# Patient Record
Sex: Female | Born: 1975
Health system: Southern US, Community
[De-identification: ages and names within clinical notes are randomized; demographics above are authoritative.]

## PROBLEM LIST (undated history)

## (undated) DIAGNOSIS — R030 Elevated blood-pressure reading, without diagnosis of hypertension: Secondary | ICD-10-CM

## (undated) DIAGNOSIS — I10 Essential (primary) hypertension: Secondary | ICD-10-CM

## (undated) DIAGNOSIS — U071 COVID-19: Secondary | ICD-10-CM

## (undated) DIAGNOSIS — D649 Anemia, unspecified: Secondary | ICD-10-CM

## (undated) HISTORY — DX: Essential (primary) hypertension: I10

---

## 2004-03-02 ENCOUNTER — Emergency Department (HOSPITAL_COMMUNITY): Admission: EM | Admit: 2004-03-02 | Discharge: 2004-03-02 | Payer: Self-pay | Admitting: Family Medicine

## 2004-03-05 ENCOUNTER — Emergency Department (HOSPITAL_COMMUNITY): Admission: EM | Admit: 2004-03-05 | Discharge: 2004-03-05 | Payer: Self-pay | Admitting: Family Medicine

## 2004-03-07 ENCOUNTER — Emergency Department (HOSPITAL_COMMUNITY): Admission: EM | Admit: 2004-03-07 | Discharge: 2004-03-07 | Payer: Self-pay | Admitting: Family Medicine

## 2004-03-10 ENCOUNTER — Emergency Department (HOSPITAL_COMMUNITY): Admission: EM | Admit: 2004-03-10 | Discharge: 2004-03-10 | Payer: Self-pay | Admitting: Family Medicine

## 2004-07-03 ENCOUNTER — Emergency Department (HOSPITAL_COMMUNITY): Admission: EM | Admit: 2004-07-03 | Discharge: 2004-07-03 | Payer: Self-pay | Admitting: Family Medicine

## 2004-07-06 ENCOUNTER — Emergency Department (HOSPITAL_COMMUNITY): Admission: EM | Admit: 2004-07-06 | Discharge: 2004-07-06 | Payer: Self-pay | Admitting: Family Medicine

## 2004-10-10 ENCOUNTER — Emergency Department (HOSPITAL_COMMUNITY): Admission: EM | Admit: 2004-10-10 | Discharge: 2004-10-10 | Payer: Self-pay | Admitting: *Deleted

## 2005-08-30 HISTORY — PX: UNILATERAL SALPINGECTOMY: SHX6160

## 2005-08-31 ENCOUNTER — Inpatient Hospital Stay (HOSPITAL_COMMUNITY): Admission: EM | Admit: 2005-08-31 | Discharge: 2005-09-02 | Payer: Self-pay | Admitting: Emergency Medicine

## 2006-10-26 ENCOUNTER — Emergency Department (HOSPITAL_COMMUNITY): Admission: EM | Admit: 2006-10-26 | Discharge: 2006-10-26 | Payer: Self-pay | Admitting: Family Medicine

## 2006-12-10 ENCOUNTER — Emergency Department (HOSPITAL_COMMUNITY): Admission: EM | Admit: 2006-12-10 | Discharge: 2006-12-10 | Payer: Self-pay | Admitting: Emergency Medicine

## 2006-12-12 ENCOUNTER — Emergency Department (HOSPITAL_COMMUNITY): Admission: EM | Admit: 2006-12-12 | Discharge: 2006-12-12 | Payer: Self-pay | Admitting: Family Medicine

## 2011-01-13 ENCOUNTER — Emergency Department (HOSPITAL_COMMUNITY)
Admission: EM | Admit: 2011-01-13 | Discharge: 2011-01-13 | Disposition: A | Discharge status: Home / Self Care | Payer: Self-pay | Attending: Emergency Medicine | Admitting: Emergency Medicine

## 2011-01-13 DIAGNOSIS — L0291 Cutaneous abscess, unspecified: Secondary | ICD-10-CM | POA: Insufficient documentation

## 2011-01-13 DIAGNOSIS — L02419 Cutaneous abscess of limb, unspecified: Secondary | ICD-10-CM | POA: Insufficient documentation

## 2011-01-17 ENCOUNTER — Emergency Department (HOSPITAL_COMMUNITY)
Admission: EM | Admit: 2011-01-17 | Discharge: 2011-01-17 | Disposition: A | Discharge status: Home / Self Care | Payer: Self-pay | Attending: Emergency Medicine | Admitting: Emergency Medicine

## 2011-01-17 DIAGNOSIS — L03119 Cellulitis of unspecified part of limb: Secondary | ICD-10-CM | POA: Insufficient documentation

## 2011-01-17 DIAGNOSIS — Z09 Encounter for follow-up examination after completed treatment for conditions other than malignant neoplasm: Secondary | ICD-10-CM | POA: Insufficient documentation

## 2011-01-17 DIAGNOSIS — L02419 Cutaneous abscess of limb, unspecified: Secondary | ICD-10-CM | POA: Insufficient documentation

## 2011-02-08 ENCOUNTER — Inpatient Hospital Stay (INDEPENDENT_AMBULATORY_CARE_PROVIDER_SITE_OTHER)
Admission: RE | Admit: 2011-02-08 | Discharge: 2011-02-08 | Disposition: A | Discharge status: Home / Self Care | Payer: Self-pay | Source: Ambulatory Visit | Attending: Emergency Medicine | Admitting: Emergency Medicine

## 2011-02-08 DIAGNOSIS — L02419 Cutaneous abscess of limb, unspecified: Secondary | ICD-10-CM

## 2011-02-11 LAB — CULTURE, ROUTINE-ABSCESS: Gram Stain: NONE SEEN

## 2011-04-17 ENCOUNTER — Emergency Department (HOSPITAL_COMMUNITY)
Admission: EM | Admit: 2011-04-17 | Discharge: 2011-04-17 | Disposition: A | Discharge status: Home / Self Care | Payer: Self-pay | Attending: Emergency Medicine | Admitting: Emergency Medicine

## 2011-04-17 DIAGNOSIS — B3731 Acute candidiasis of vulva and vagina: Secondary | ICD-10-CM | POA: Insufficient documentation

## 2011-04-17 DIAGNOSIS — B373 Candidiasis of vulva and vagina: Secondary | ICD-10-CM | POA: Insufficient documentation

## 2011-04-17 LAB — WET PREP, GENITAL: Trich, Wet Prep: NONE SEEN

## 2011-04-17 LAB — URINALYSIS, ROUTINE W REFLEX MICROSCOPIC
Bilirubin Urine: NEGATIVE
Glucose, UA: NEGATIVE mg/dL
Hgb urine dipstick: NEGATIVE
Specific Gravity, Urine: 1.024 (ref 1.005–1.030)

## 2011-04-17 LAB — POCT PREGNANCY, URINE: Preg Test, Ur: NEGATIVE

## 2011-04-19 LAB — GC/CHLAMYDIA PROBE AMP, GENITAL: GC Probe Amp, Genital: NEGATIVE

## 2012-05-18 ENCOUNTER — Encounter (HOSPITAL_COMMUNITY): Payer: Self-pay | Admitting: *Deleted

## 2012-05-18 ENCOUNTER — Emergency Department (HOSPITAL_COMMUNITY)
Admission: EM | Admit: 2012-05-18 | Discharge: 2012-05-18 | Disposition: A | Discharge status: Home / Self Care | Payer: Self-pay | Attending: Emergency Medicine | Admitting: Emergency Medicine

## 2012-05-18 DIAGNOSIS — M549 Dorsalgia, unspecified: Secondary | ICD-10-CM

## 2012-05-18 DIAGNOSIS — M545 Low back pain, unspecified: Secondary | ICD-10-CM | POA: Insufficient documentation

## 2012-05-18 DIAGNOSIS — M62838 Other muscle spasm: Secondary | ICD-10-CM | POA: Insufficient documentation

## 2012-05-18 DIAGNOSIS — R109 Unspecified abdominal pain: Secondary | ICD-10-CM | POA: Insufficient documentation

## 2012-05-18 DIAGNOSIS — IMO0001 Reserved for inherently not codable concepts without codable children: Secondary | ICD-10-CM | POA: Insufficient documentation

## 2012-05-18 LAB — URINALYSIS, ROUTINE W REFLEX MICROSCOPIC
Bilirubin Urine: NEGATIVE
Glucose, UA: NEGATIVE mg/dL
Hgb urine dipstick: NEGATIVE
Ketones, ur: NEGATIVE mg/dL
Protein, ur: NEGATIVE mg/dL
Urobilinogen, UA: 1 mg/dL (ref 0.0–1.0)
pH: 6 (ref 5.0–8.0)

## 2012-05-18 MED ORDER — NAPROXEN 500 MG PO TABS: 500.0000 mg | ORAL_TABLET | Freq: Two times a day (BID) | ORAL | Status: DC

## 2012-05-18 MED ORDER — KETOROLAC TROMETHAMINE 60 MG/2ML IM SOLN
60.0000 mg | Freq: Once | INTRAMUSCULAR | Status: AC
  Administered 2012-05-18: 60 mg via INTRAMUSCULAR
  Filled 2012-05-18: qty 2

## 2012-05-18 NOTE — ED Provider Notes (Signed)
Medical screening examination/treatment/procedure(s) were performed by non-physician practitioner and as supervising physician I was immediately available for consultation/collaboration.  Joshwa Hemric, MD 05/18/12 2032 

## 2012-05-18 NOTE — ED Notes (Signed)
Patient with c/o bilateral flank.side pain for about two weeks and states that the pain is worse when she pick up heavy objects.  Patient denies any urinary symptoms.

## 2012-05-18 NOTE — ED Provider Notes (Signed)
History     CSN: [id number]  Arrival date & time 05/18/12  1624   First MD Initiated Contact with Patient 05/18/12 1741      Chief Complaint  Patient presents with  . Flank Pain    (Consider location/radiation/quality/duration/timing/severity/associated sxs/prior treatment) HPI Comments: 72 old female with a history of hypertension and diabetes presents emergency department with a chief complaint of back pain.  Onset of symptoms began approximately 2 weeks ago and been gradually worsening.  Pain is located in bilateral flank areas.  Pain is worsened with heavy lifting and movement.  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 Pt denies being sexually active, having urinary symptoms, radiating pain, lower extremity weakness or tingling, fever, nights sweats or chills. Pt has taken goody powder with only minimal relief.   The history is provided by the patient.    History reviewed. No pertinent past medical history.  History reviewed. No pertinent past surgical history.  History reviewed. No pertinent family history.  History  Substance Use Topics  . Smoking status: Current Some Day Smoker  . Smokeless tobacco: Not on file  . Alcohol Use: Yes    OB History    Grav Para Term Preterm Abortions TAB SAB Ect Mult Living                  Review of Systems  Constitutional: Negative for fever, chills and appetite change.  HENT: Negative for congestion.   Eyes: Negative for visual disturbance.  Respiratory: Negative for shortness of breath.   Cardiovascular: Negative for chest pain and leg swelling.  Gastrointestinal: Negative for abdominal pain.  Genitourinary: Negative for dysuria, urgency and frequency.  Musculoskeletal: Positive for myalgias and back pain.  Neurological: Negative for dizziness, syncope, weakness, light-headedness, numbness and headaches.  Psychiatric/Behavioral: Negative for confusion.    Allergies  Sulfa antibiotics  Home Medications    Current Outpatient Rx  Name Route Sig Dispense Refill  . BC HEADACHE POWDER PO Oral Take 1 packet by mouth 2 (two) times daily as needed. For pain      BP 143/76  Pulse 74  Temp 98.4 F (36.9 C) (Oral)  Resp 16  SpO2 98%  Physical Exam  Nursing note and vitals reviewed. Constitutional: She is oriented to person, place, and time. She appears well-developed and well-nourished. No distress.  HENT:  Head: Normocephalic and atraumatic.  Eyes: Conjunctivae normal and EOM are normal. Pupils are equal, round, and reactive to light. No scleral icterus.  Neck: Normal range of motion and full passive range of motion without pain. Neck supple. No spinous process tenderness and no muscular tenderness present. No rigidity. Normal range of motion present. No Brudzinski's sign noted.  Cardiovascular: Normal rate, regular rhythm and intact distal pulses.  Exam reveals no gallop and no friction rub.   No murmur heard. Pulmonary/Chest: Effort normal and breath sounds normal. No respiratory distress. She has no wheezes. She has no rales. She exhibits no tenderness.  Musculoskeletal: Normal range of motion.       Cervical back: She exhibits normal range of motion, no tenderness, no bony tenderness and no pain.       Thoracic back: She exhibits no tenderness, no bony tenderness and no pain.       Lumbar back: She exhibits tenderness and pain. She exhibits no bony tenderness, no spasm and normal pulse.       Back:       Right foot: She exhibits no swelling.  Left foot: She exhibits no swelling.       Bilateral lower extremities nontender without color change, baseline range of motion of extremities with intact distal pulses, capillary refill less than 2 seconds bilaterally.  Pt has increased pain w ROM of lumbar spine. Pain w ambulation, no sign of ataxia.  Neurological: She is alert and oriented to person, place, and time. She has normal strength and normal reflexes. No sensory deficit. Gait  normal.       Sensation at baseline for light touch in all 4 distal extremities, motor symmetric & bilateral 5/5 (hips: abduction, adduction, flexion; knee: flexion & extension; foot: dorsiflexion, plantar flexion, toes: dorsi flexion) Patellar & ankle reflexes intact.   Skin: Skin is warm and dry. No rash noted. She is not diaphoretic. No erythema. No pallor.  Psychiatric: She has a normal mood and affect. Her behavior is normal.    ED Course  Procedures (including critical care time)  Labs Reviewed  URINALYSIS, ROUTINE W REFLEX MICROSCOPIC - Abnormal; Notable for the following:    APPearance CLOUDY (*)     All other components within normal limits  POCT PREGNANCY, URINE   No results found.   No diagnosis found.    MDM  Low back pain, muscle spasms  Patient with back pain.  No neurological deficits and normal neuro exam.  Patient can walk without difficulty.  No loss of bowel or bladder control.  No concern for cauda equina.  No fever, night sweats, weight loss, h/o cancer, IVDU.  RICE protocol and pain medicine indicated and discussed with patient.Wk note given with 1 day off and return w no heavy lifting x 1 week. reccommended back brace.           Jaci Carrel, [place] 05/18/12 0981

## 2013-02-08 ENCOUNTER — Emergency Department (HOSPITAL_COMMUNITY): Payer: Self-pay

## 2013-02-08 ENCOUNTER — Observation Stay (HOSPITAL_COMMUNITY)
Admission: EM | Admit: 2013-02-08 | Discharge: 2013-02-09 | Disposition: A | Discharge status: Home / Self Care | Payer: Self-pay | Attending: Family Medicine | Admitting: Family Medicine

## 2013-02-08 ENCOUNTER — Encounter (HOSPITAL_COMMUNITY): Payer: Self-pay

## 2013-02-08 DIAGNOSIS — N390 Urinary tract infection, site not specified: Secondary | ICD-10-CM | POA: Insufficient documentation

## 2013-02-08 DIAGNOSIS — R109 Unspecified abdominal pain: Secondary | ICD-10-CM | POA: Insufficient documentation

## 2013-02-08 DIAGNOSIS — D62 Acute posthemorrhagic anemia: Secondary | ICD-10-CM | POA: Insufficient documentation

## 2013-02-08 DIAGNOSIS — D649 Anemia, unspecified: Secondary | ICD-10-CM

## 2013-02-08 DIAGNOSIS — N946 Dysmenorrhea, unspecified: Secondary | ICD-10-CM | POA: Insufficient documentation

## 2013-02-08 DIAGNOSIS — N920 Excessive and frequent menstruation with regular cycle: Principal | ICD-10-CM | POA: Insufficient documentation

## 2013-02-08 DIAGNOSIS — N938 Other specified abnormal uterine and vaginal bleeding: Secondary | ICD-10-CM

## 2013-02-08 DIAGNOSIS — K5900 Constipation, unspecified: Secondary | ICD-10-CM | POA: Insufficient documentation

## 2013-02-08 DIAGNOSIS — D259 Leiomyoma of uterus, unspecified: Secondary | ICD-10-CM | POA: Insufficient documentation

## 2013-02-08 LAB — URINALYSIS, ROUTINE W REFLEX MICROSCOPIC
Protein, ur: 30 mg/dL — AB
Urobilinogen, UA: 0.2 mg/dL (ref 0.0–1.0)
pH: 5.5 (ref 5.0–8.0)

## 2013-02-08 LAB — CBC WITH DIFFERENTIAL/PLATELET
Basophils Relative: 1 % (ref 0–1)
HCT: 12.2 % — ABNORMAL LOW (ref 36.0–46.0)
Lymphocytes Relative: 26 % (ref 12–46)
MCHC: 26.2 g/dL — ABNORMAL LOW (ref 30.0–36.0)
MCV: 57 fL — ABNORMAL LOW (ref 78.0–100.0)
Neutro Abs: 3.3 10*3/uL (ref 1.7–7.7)
Neutrophils Relative %: 66 % (ref 43–77)
Platelets: 510 10*3/uL — ABNORMAL HIGH (ref 150–400)
RBC: 2.14 MIL/uL — ABNORMAL LOW (ref 3.87–5.11)
RDW: 24 % — ABNORMAL HIGH (ref 11.5–15.5)

## 2013-02-08 LAB — URINE MICROSCOPIC-ADD ON

## 2013-02-08 LAB — PREPARE RBC (CROSSMATCH)

## 2013-02-08 LAB — ABO/RH: ABO/RH(D): O POS

## 2013-02-08 LAB — POCT I-STAT, CHEM 8
HCT: 13 % — ABNORMAL LOW (ref 36.0–46.0)
Hemoglobin: 4.4 g/dL — CL (ref 12.0–15.0)
Potassium: 3.6 mEq/L (ref 3.5–5.1)
Sodium: 140 mEq/L (ref 135–145)
TCO2: 23 mmol/L (ref 0–100)

## 2013-02-08 LAB — POCT PREGNANCY, URINE: Preg Test, Ur: NEGATIVE

## 2013-02-08 MED ORDER — ONDANSETRON HCL 4 MG/2ML IJ SOLN
4.0000 mg | Freq: Once | INTRAMUSCULAR | Status: AC
  Administered 2013-02-08: 4 mg via INTRAVENOUS
  Filled 2013-02-08: qty 2

## 2013-02-08 MED ORDER — DEXTROSE 5 % IV SOLN
1.0000 g | Freq: Once | INTRAVENOUS | Status: AC
  Administered 2013-02-08: 1 g via INTRAVENOUS
  Filled 2013-02-08: qty 10

## 2013-02-08 NOTE — ED Provider Notes (Signed)
Medical screening examination/treatment/procedure(s) were performed by non-physician practitioner and as supervising physician I was immediately available for consultation/collaboration.   Rolan Bucco, MD 02/08/13 2249

## 2013-02-08 NOTE — ED Provider Notes (Signed)
History     CSN: [id number]  Arrival date & time 02/08/13  7829   First MD Initiated Contact with Patient 02/08/13 2002      Chief Complaint  Patient presents with  . Abdominal Cramping  . Constipation  . Dysmenorrhea    (Consider location/radiation/quality/duration/timing/severity/associated sxs/prior treatment) HPI  Jodi Mendez is a 37 y.o. female complaining of bilateral lower abdominal cramping, she has been menstruating for 2 weeks going through one pad every one to 2 hours. This is not typical for her although her menstruation is normally irregular and heavy. She is also developed fatigue, a headache and 2 episodes of nonbloody, nonbilious emesis happening over the last 3 days. She does get lightheaded when going from sitting to standing but denies any chest pain, palpitations, shortness of breath. Patient states that she's also been constipated with infrequent bowel movements for the last week. Patient rates her lower abdominal pain at 5/10, she states that this is the best it has been. She's been taking naproxen without relief. She cannot identify any exacerbating or alleviating factors. She describes it as crampy.   No PCP or OB/GYN.   History reviewed. No pertinent past medical history.  Past Surgical History  Procedure Laterality Date  . Dilation and curettage of uterus      No family history on file.  History  Substance Use Topics  . Smoking status: Never Smoker   . Smokeless tobacco: Not on file  . Alcohol Use: Yes     Comment: social    OB History   Grav Para Term Preterm Abortions TAB SAB Ect Mult Living                  Review of Systems  Constitutional: Positive for fatigue. Negative for fever.  Respiratory: Negative for shortness of breath.   Cardiovascular: Negative for chest pain.  Gastrointestinal: Negative for nausea, vomiting, abdominal pain and diarrhea.  Genitourinary: Positive for menstrual problem.  Neurological: Positive for  light-headedness.  All other systems reviewed and are negative.    Allergies  Sulfa antibiotics  Home Medications   Current Outpatient Rx  Name  Route  Sig  Dispense  Refill  . naproxen sodium (ANAPROX) 220 MG tablet   Oral   Take 220 mg by mouth 2 (two) times daily as needed (pain).            BP 124/75  Pulse 84  Temp(Src) 99.2 F (37.3 C) (Oral)  Resp 18  SpO2 100%  LMP 01/28/2013  Physical Exam  Nursing note and vitals reviewed. Constitutional: She is oriented to person, place, and time. She appears well-developed and well-nourished. No distress.  HENT:  Head: Normocephalic.  Significant conjunctival pallor  Eyes: Conjunctivae and EOM are normal. Pupils are equal, round, and reactive to light.  Neck: Normal range of motion.  Cardiovascular: Normal rate and regular rhythm.   Murmur heard. Pulmonary/Chest: Effort normal and breath sounds normal. No stridor. No respiratory distress. She has no wheezes. She has no rales.  Abdominal: Soft. Bowel sounds are normal. She exhibits no distension and no mass. There is tenderness. There is no rebound and no guarding.  Mild tenderness to deep palpation of the bilateral lower quadrants with no guarding or rebound  Musculoskeletal: Normal range of motion.  Neurological: She is alert and oriented to person, place, and time.  Psychiatric: She has a normal mood and affect.    ED Course  Procedures (including critical care time)  Labs Reviewed  CBC WITH DIFFERENTIAL - Abnormal; Notable for the following:    RBC 2.14 (*)    Hemoglobin 3.2 (*)    HCT 12.2 (*)    MCV 57.0 (*)    MCH 15.0 (*)    MCHC 26.2 (*)    RDW 24.0 (*)    Platelets 510 (*)    All other components within normal limits  URINALYSIS, ROUTINE W REFLEX MICROSCOPIC - Abnormal; Notable for the following:    Color, Urine AMBER (*)    APPearance CLOUDY (*)    Hgb urine dipstick LARGE (*)    Protein, ur 30 (*)    Leukocytes, UA SMALL (*)    All other  components within normal limits  URINE MICROSCOPIC-ADD ON - Abnormal; Notable for the following:    Bacteria, UA MANY (*)    All other components within normal limits  POCT I-STAT, CHEM 8 - Abnormal; Notable for the following:    Hemoglobin 4.4 (*)    HCT 13.0 (*)    All other components within normal limits  URINE CULTURE  POCT PREGNANCY, URINE  PREPARE RBC (CROSSMATCH)  TYPE AND SCREEN   No results found.   1. Anemia   2. DUB (dysfunctional uterine bleeding)   3. UTI (lower urinary tract infection)       MDM   Filed Vitals:   02/08/13 1922  BP: 124/75  Pulse: 84  Temp: 99.2 F (37.3 C)  TempSrc: Oral  Resp: 18  SpO2: 100%     Jodi Mendez is a 37 y.o. female  with heavy menstrual bleeding for 14 days. Patient is significantly anemic with a hemoglobin of 4.4. 2 units ordered for transfusion. Transvaginal ultrasound also ordered to evaluate because of heavy menstruation  Urinalysis shows many bacteria with 7-10 white blood cells and leukocytes. I will give her a gram of Rocephin through the IV.  Patient declines pain medication.  Pt will transferred to [facility] hospital, accepting physician Dr. Shawnie Pons   Medications  ondansetron [facility]) injection 4 mg (not administered)  cefTRIAXone (ROCEPHIN) 1 g in dextrose 5 % 50 mL IVPB (not administered)        Wynetta Emery, PA-C 02/08/13 2133

## 2013-02-08 NOTE — ED Notes (Signed)
Pt c/o heavy menstral cycle which is the second one this month, c/o vomiting off and on, abdominal pain x1wk with constipation

## 2013-02-08 NOTE — ED Notes (Signed)
Critical values on I stat.  Pt is in waiting room - has no doctor assigned.  Triage RN Victorino Dike and Charge RN Tiffany aware.  CBC has been sent to lab to confirm values.

## 2013-02-09 ENCOUNTER — Encounter (HOSPITAL_COMMUNITY): Payer: Self-pay | Admitting: Family Medicine

## 2013-02-09 DIAGNOSIS — D62 Acute posthemorrhagic anemia: Secondary | ICD-10-CM | POA: Diagnosis present

## 2013-02-09 DIAGNOSIS — N390 Urinary tract infection, site not specified: Secondary | ICD-10-CM

## 2013-02-09 DIAGNOSIS — D259 Leiomyoma of uterus, unspecified: Secondary | ICD-10-CM | POA: Diagnosis present

## 2013-02-09 DIAGNOSIS — N920 Excessive and frequent menstruation with regular cycle: Secondary | ICD-10-CM | POA: Diagnosis present

## 2013-02-09 LAB — CBC
HCT: 24.1 % — ABNORMAL LOW (ref 36.0–46.0)
MCH: 22.6 pg — ABNORMAL LOW (ref 26.0–34.0)
MCH: 22.8 pg — ABNORMAL LOW (ref 26.0–34.0)
MCHC: 32 g/dL (ref 30.0–36.0)
Platelets: 390 10*3/uL (ref 150–400)
RBC: 3.38 MIL/uL — ABNORMAL LOW (ref 3.87–5.11)
RDW: 27.1 % — ABNORMAL HIGH (ref 11.5–15.5)
RDW: 27.2 % — ABNORMAL HIGH (ref 11.5–15.5)
WBC: 5.9 10*3/uL (ref 4.0–10.5)

## 2013-02-09 LAB — PREPARE RBC (CROSSMATCH)

## 2013-02-09 LAB — ABO/RH: ABO/RH(D): O POS

## 2013-02-09 MED ORDER — DIPHENHYDRAMINE HCL 25 MG PO CAPS
25.0000 mg | ORAL_CAPSULE | Freq: Once | ORAL | Status: AC
  Administered 2013-02-09: 25 mg via ORAL
  Filled 2013-02-09: qty 1

## 2013-02-09 MED ORDER — SODIUM CHLORIDE 0.9 % IV SOLN
INTRAVENOUS | Status: DC
  Administered 2013-02-09: 20 mL/h via INTRAVENOUS

## 2013-02-09 MED ORDER — MENTHOL 3 MG MT LOZG: 1.0000 | LOZENGE | OROMUCOSAL | Status: DC | PRN

## 2013-02-09 MED ORDER — ALUM & MAG HYDROXIDE-SIMETH 200-200-20 MG/5ML PO SUSP: 30.0000 mL | ORAL | Status: DC | PRN

## 2013-02-09 MED ORDER — ONDANSETRON HCL 4 MG/2ML IJ SOLN
4.0000 mg | Freq: Once | INTRAMUSCULAR | Status: AC
  Administered 2013-02-09: 4 mg via INTRAVENOUS
  Filled 2013-02-09: qty 2

## 2013-02-09 MED ORDER — MORPHINE SULFATE 4 MG/ML IJ SOLN
4.0000 mg | Freq: Once | INTRAMUSCULAR | Status: AC
  Administered 2013-02-09: 4 mg via INTRAVENOUS
  Filled 2013-02-09: qty 1

## 2013-02-09 MED ORDER — ACETAMINOPHEN 325 MG PO TABS
650.0000 mg | ORAL_TABLET | Freq: Once | ORAL | Status: AC
  Administered 2013-02-09: 650 mg via ORAL
  Filled 2013-02-09: qty 2

## 2013-02-09 MED ORDER — IBUPROFEN 600 MG PO TABS
600.0000 mg | ORAL_TABLET | Freq: Four times a day (QID) | ORAL | Status: DC | PRN
  Administered 2013-02-09 (×2): 600 mg via ORAL
  Filled 2013-02-09 (×2): qty 1

## 2013-02-09 MED ORDER — TEMAZEPAM 15 MG PO CAPS: 15.0000 mg | ORAL_CAPSULE | Freq: Every evening | ORAL | Status: DC | PRN

## 2013-02-09 MED ORDER — OXYCODONE-ACETAMINOPHEN 5-325 MG PO TABS
1.0000 | ORAL_TABLET | ORAL | Status: DC | PRN
Start: 2013-02-09 — End: 2013-02-10
  Administered 2013-02-09: 1 via ORAL
  Filled 2013-02-09: qty 1

## 2013-02-09 MED ORDER — MEGESTROL ACETATE 40 MG PO TABS: 40.0000 mg | ORAL_TABLET | Freq: Three times a day (TID) | ORAL | Status: DC

## 2013-02-09 MED ORDER — THERA VITAL M PO TABS: 1.0000 | ORAL_TABLET | Freq: Every day | ORAL | Status: DC

## 2013-02-09 MED ORDER — GUAIFENESIN 100 MG/5ML PO SOLN: 15.0000 mL | ORAL | Status: DC | PRN

## 2013-02-09 MED ORDER — MEGESTROL ACETATE 40 MG PO TABS
40.0000 mg | ORAL_TABLET | Freq: Four times a day (QID) | ORAL | Status: DC
  Administered 2013-02-09 (×4): 40 mg via ORAL
  Filled 2013-02-09 (×7): qty 1

## 2013-02-09 NOTE — H&P (Signed)
History    Chief Complaint   Patient presents with   .  Abdominal Cramping   .  Constipation   .  Dysmenorrhea    HPI  Jodi Mendez is a 37 y.o. [id number] female complaining of bilateral lower abdominal cramping, she has been menstruating for 2 weeks going through one pad every one to 2 hours. This is not typical for her although her menstruation is normally irregular and heavy. Has been having 2 cycles/month since October. She is also developed fatigue, a headache and 2 episodes of nonbloody, nonbilious emesis happening over the last 3 days. She does get lightheaded when going from sitting to standing but denies any chest pain, palpitations, shortness of breath, although she endorses DOE. Patient states that she's also been constipated with infrequent bowel movements for the last week. Patient rates her lower abdominal pain at 5/10, she states that this is the best it has been. She's been taking naproxen without relief. She cannot identify any exacerbating or alleviating factors. She describes it as crampy.  No PCP or OB/GYN.   Past Medical History: No pertinent past medical history.   Past Surgical History   Procedure  Laterality  Date    Laparscopic salpingectomy for ectopic pregnancy Left    No family history on file.   Social History   Substance Use Topics   .  Smoking status:  Never Smoker   .  Smokeless tobacco:  Not on file   .  Alcohol Use:  Yes      Comment: social     OB History    Grav  Para  Term  Preterm  Abortions  TAB  SAB  Ect  Mult  Living    1 0 0 0 1   1  0     Review of Systems  Constitutional: Positive for fatigue. Negative for fever.  Respiratory: Negative for shortness of breath.  Cardiovascular: Negative for chest pain.  Gastrointestinal: Negative for nausea, vomiting, abdominal pain and diarrhea.  Genitourinary: Positive for menstrual problem.  Neurological: Positive for light-headedness.  All other systems reviewed and are negative.    Allergies   Sulfa antibiotics   Home Medications    Current Outpatient Rx   Name   Route   Sig   Dispense   Refill   .  naproxen sodium (ANAPROX) 220 MG tablet   Oral   Take 220 mg by mouth 2 (two) times daily as needed (pain).        Physical Exam BP 124/75  Pulse 84  Temp(Src) 99.2 F (37.3 C) (Oral)  Resp 18  SpO2 100%  LMP 01/28/2013   Nursing note and vitals reviewed.  Constitutional: She is oriented to person, place, and time. She appears well-developed and well-nourished. No distress.  HENT:  Head: Normocephalic.  Significant conjunctival pallor  Eyes: Conjunctivae and EOM are normal. Pupils are equal, round, and reactive to light.  Neck: Normal range of motion.  Cardiovascular: Normal rate and regular rhythm.  Murmur heard.  Pulmonary/Chest: Effort normal and breath sounds normal. No stridor. No respiratory distress. She has no wheezes. She has no rales.  Abdominal: Soft. Bowel sounds are normal. She exhibits no distension and no mass. There is tenderness. There is no rebound and no guarding.  Mild tenderness to deep palpation of the bilateral lower quadrants with no guarding or rebound  Musculoskeletal: Normal range of motion.  Neurological: She is alert and oriented to person, place, and time.  Psychiatric: She  has a normal mood and affect.    CBC WITH DIFFERENTIAL - Abnormal; Notable for the following:    RBC  2.14 (*)     Hemoglobin  3.2 (*)     HCT  12.2 (*)     MCV  57.0 (*)     MCH  15.0 (*)     MCHC  26.2 (*)     RDW  24.0 (*)     Platelets  510 (*)     All other components within normal limits   URINALYSIS, ROUTINE W REFLEX MICROSCOPIC - Abnormal; Notable for the following:    Color, Urine  AMBER (*)     APPearance  CLOUDY (*)     Hgb urine dipstick  LARGE (*)     Protein, ur  30 (*)     Leukocytes, UA  SMALL (*)     All other components within normal limits   URINE MICROSCOPIC-ADD ON - Abnormal; Notable for the following:    Bacteria, UA  MANY  (*)     All other components within normal limits   POCT I-STAT, CHEM 8 - Abnormal; Notable for the following:    Hemoglobin  4.4 (*)     HCT  13.0 (*)     All other components within normal limits     POCT PREGNANCY, URINE  Negative    TYPE AND SCREEN O pos   US Transvaginal Non-ob  02/09/2013   *RADIOLOGY REPORT*  Clinical Data: Heavy menses with left sided pelvic pain.  TRANSABDOMINAL AND TRANSVAGINAL ULTRASOUND OF PELVIS Technique:  Both transabdominal and transvaginal ultrasound examinations of the pelvis were performed. Transabdominal technique was performed for global imaging of the pelvis including uterus, ovaries, adnexal regions, and pelvic cul-de-sac.  It was necessary to proceed with endovaginal exam following the transabdominal exam to visualize the uterus and ovaries.  Comparison:  08/31/2005  Findings:  Uterus: The uterus measures approximately 9.8 x 6.3 x 8.8 cm. Echotexture of the uterus is quite heterogeneous with multiple fibroids suspected.  The largest measurable area measures approximately 4.9 x 3.2 x 3.0 cm at the level of the fundus.  Three other predominately intramural fibroids are suspected measuring approximately 4.2, 3.2 and 3.1 cm in greatest respective diameter.  Endometrium: The endometrium measures 17 mm in thickness.  Right ovary:  The right ovary is not visible.  Left ovary: The left ovary measures approximately 4.1 x 2.2 x 2.6 cm and has a normal sonographic appearance with normal follicles present.  Other findings: Tubular structure is noted adjacent to the left ovary and adnexal region containing complex fluid and not definitively shown peristalsis.  This measures roughly 4.5 cm in length and 1 cm in diameter.  This may represent a discrete tubo- ovarian abscess or pyosalpinx.  Hemorrhagic fluid in a dilated fallopian tube may also have this appearance.  Correlation is suggested with any signs of infection clinically.  IMPRESSION:  1.  Fibroid uterus with four  measurable fibroids identified by ultrasound.  The largest measures 4.9 cm. 2.  Tubular structure of the left adnexal region is visualized containing complex fluid.  This does not appear to represent bowel and is suspicious for either infection or hemorrhage in a dilated fallopian tube.   Original Report Authenticated By: Irish Lack, M.D.    Assessment Patient Active Problem List   Diagnosis Date Noted  . Acute blood loss anemia 02/09/2013  . Menorrhagia 02/09/2013  . Leiomyoma of uterus 02/09/2013   Plan Admit Transfuse  4 u PRBC's Check TSH Will need EMB as out patient Megace to stop bleeding H/o MRSA-will check nasal swab.

## 2013-02-09 NOTE — Discharge Summary (Signed)
Physician Discharge Summary  Patient ID: Jodi Mendez MRN: [id number] DOB/AGE: [birth year] 37 y.o.  Admit date: 02/08/2013 Discharge date: 02/09/2013  Admission Diagnoses: menorrhagia, anemia, uterine fibroids  Discharge Diagnoses:  Principal Problem:   Acute blood loss anemia Active Problems:   Menorrhagia   Leiomyoma of uterus   Discharged Condition: good  Hospital Course: admitted with Hgb 3.2, severe iron deficient anemia due to heavy menstrual blood loss. Ultrasound:  Ut fibroids  Consults: none  Significant Diagnostic Studies: labs:  CBC    Component Value Date/Time   WBC 5.9 02/09/2013 2200   RBC 3.38* 02/09/2013 2200   HGB 7.7* 02/09/2013 2200   HCT 23.8* 02/09/2013 2200   PLT 390 02/09/2013 2200   MCV 70.4* 02/09/2013 2200   MCH 22.8* 02/09/2013 2200   MCHC 32.4 02/09/2013 2200   RDW 27.2* 02/09/2013 2200   LYMPHSABS 1.3 02/08/2013 1945   MONOABS 0.3 02/08/2013 1945   EOSABS 0.1 02/08/2013 1945   BASOSABS 0.1 02/08/2013 1945      Treatments: transfusion x 4 units.  Discharge Exam: Blood pressure 130/73, pulse 66, temperature 98.4 F (36.9 C), temperature source Oral, resp. rate 18, height 5\' 3"  (1.6 m), weight 180 lb 8 oz (81.874 kg), last menstrual period 01/28/2013, SpO2 100.00%. General appearance: alert, cooperative, appears stated age, no distress and pale Head: Normocephalic, without obvious abnormality, . GI: soft, non-tender; bowel sounds normal; no masses,  no organomegaly Skin: Skin color, texture, turgor normal. No rashes or lesions or good tissue perfusion  Disposition: 01-Home or Self Care  Discharge Orders   Future Orders Complete By Expires     Call MD for:  persistant nausea and vomiting  As directed     Call MD for:  severe uncontrolled pain  As directed     Call MD for:  As directed     Scheduling Instructions:      followup appointment at Gyn clinic, [phone] in 2-4 weeks please call.    Diet - low sodium heart healthy  As directed     Discharge instructions  As directed     Comments:      Continue megace til followup at gyn clinic    Increase activity slowly  As directed         Medication List    TAKE these medications       megestrol 40 MG tablet  Commonly known as:  MEGACE  Take 1 tablet (40 mg total) by mouth 3 (three) times daily. Tid Til bleeding stops then once daily til followup visit.     multivitamin tablet  Take 1 tablet by mouth daily.     naproxen sodium 220 MG tablet  Commonly known as:  ANAPROX  Take 220 mg by mouth 2 (two) times daily as needed (pain).         SignedTilda Burrow 02/09/2013, 11:42 PM

## 2013-02-09 NOTE — Progress Notes (Signed)
Subjective: Patient reports that she feels actually well, has received 4 units PBC, and begun on Megace yesterday.Bleeding has become light . .    Objective: I have reviewed patient's vital signs and labs. CBC    Component Value Date/Time   WBC 5.9 02/09/2013 2200   RBC 3.38* 02/09/2013 2200   HGB 7.7* 02/09/2013 2200   HCT 23.8* 02/09/2013 2200   PLT 390 02/09/2013 2200   MCV 70.4* 02/09/2013 2200   MCH 22.8* 02/09/2013 2200   MCHC 32.4 02/09/2013 2200   RDW 27.2* 02/09/2013 2200   LYMPHSABS 1.3 02/08/2013 1945   MONOABS 0.3 02/08/2013 1945   EOSABS 0.1 02/08/2013 1945   BASOSABS 0.1 02/08/2013 1945       General: alert, cooperative and no distress Extremities: extremities normal, atraumatic, no cyanosis or edema Vaginal Bleeding: minimal   Assessment/Plan: Anemia secondary to DUB from fibroids. Probably hydrosalpinx S/p tubal ligation Desires to preserve fertility. Plan:  dischg home    Transport to car at [facility] arranged Followup 2 wk -4 wk gyn clinic Rx Megace 40 tid til bleeding stops then daily    LOS: 1 day    Giuliana Handyside V 02/09/2013, 11:19 PM

## 2013-02-09 NOTE — Progress Notes (Addendum)
Pt arrived to [facility] Unit at 0235 via [facility]. Saline lock intact. Report had been called from [facility] stating that pt had orders to received two units of blood at [facility]. [facility] RN stated upon arrival that pt only received one unit at [facility], and the second unit was not given.

## 2013-02-09 NOTE — Progress Notes (Signed)
Pt has history of MRSA in 2004. Placed on isolation precautions per protocol. Dr. Shawnie Pons stated could check MRSA pcr, and if it came back negative pt could be taken off of isolation precautions. MRSA swab came back negative. Isolation precautions discontinued.

## 2013-02-09 NOTE — ED Notes (Signed)
Report called to Irving Burton, RN at [facility]; pt to be admitted to room 305.

## 2013-02-10 LAB — URINE CULTURE

## 2013-02-10 LAB — TYPE AND SCREEN
ABO/RH(D): O POS
Unit division: 0

## 2013-02-10 NOTE — Progress Notes (Signed)
Patient discharged via taxi to [facility] ED to pick up car where she initially left it prior to being transferred to [facility]. No home health or equipment needed. Patient in stable condition. Walked down to MAU by Luisa Dago, NT.

## 2013-02-12 LAB — TYPE AND SCREEN

## 2013-02-27 ENCOUNTER — Encounter (HOSPITAL_COMMUNITY): Payer: Self-pay | Admitting: Emergency Medicine

## 2013-02-27 ENCOUNTER — Emergency Department (INDEPENDENT_AMBULATORY_CARE_PROVIDER_SITE_OTHER)
Admission: EM | Admit: 2013-02-27 | Discharge: 2013-02-27 | Disposition: A | Discharge status: Home / Self Care | Payer: Self-pay | Source: Home / Self Care

## 2013-02-27 DIAGNOSIS — R238 Other skin changes: Secondary | ICD-10-CM

## 2013-02-27 DIAGNOSIS — L988 Other specified disorders of the skin and subcutaneous tissue: Secondary | ICD-10-CM

## 2013-02-27 NOTE — ED Notes (Signed)
Patient documented on information sheet: allergic reaction .  Onset one week ago.  [name] np at bedside

## 2013-02-27 NOTE — ED Notes (Signed)
MD at bedside. 

## 2013-02-27 NOTE — ED Provider Notes (Signed)
History    CSN: [id number] Arrival date & time 02/27/13  1006  None    Chief Complaint  Patient presents with  . Allergic Reaction   (Consider location/radiation/quality/duration/timing/severity/associated sxs/prior Treatment) HPI Comments: 37 Y O F with small pruritic papules in low density primarily to the upper extremities and lesser to the trunk such as the ridge of the L trapezius. Started 3-4 days ago . No systemic symptoms, feels well otherwise. Benadryl helps with itching and that has decreased.   History reviewed. No pertinent past medical history. Past Surgical History  Procedure Laterality Date  . Laparoscopic unilateral salpingectomy Left 2007    Ectopic pregnancy   No family history on file. History  Substance Use Topics  . Smoking status: Never Smoker   . Smokeless tobacco: Never Used  . Alcohol Use: Yes     Comment: social   OB History   Grav Para Term Preterm Abortions TAB SAB Ect Mult Living   1 0   1   1       Review of Systems  Constitutional: Negative for fever, diaphoresis, activity change, appetite change and fatigue.  HENT: Negative for congestion, sore throat, facial swelling, rhinorrhea, sneezing, trouble swallowing, neck pain, neck stiffness and postnasal drip.   Eyes: Negative.   Respiratory: Negative.   Gastrointestinal: Negative.   Musculoskeletal: Negative.   Neurological: Negative.   Hematological: Bruises/bleeds easily.    Allergies  Sulfa antibiotics  Home Medications   Current Outpatient Rx  Name  Route  Sig  Dispense  Refill  . megestrol (MEGACE) 40 MG tablet   Oral   Take 1 tablet (40 mg total) by mouth 3 (three) times daily. Tid Til bleeding stops then once daily til followup visit.   90 tablet   2   . Multiple Vitamins-Minerals (MULTIVITAMIN) tablet   Oral   Take 1 tablet by mouth daily.   30 tablet   prn   . naproxen sodium (ANAPROX) 220 MG tablet   Oral   Take 220 mg by mouth 2 (two) times daily as needed  (pain).           BP 158/93  Pulse 57  Temp(Src) 98.3 F (36.8 C) (Oral)  Resp 16  SpO2 100%  LMP 01/28/2013 Physical Exam  Nursing note and vitals reviewed. Constitutional: She is oriented to person, place, and time. She appears well-developed and well-nourished. No distress.  HENT:  Mouth/Throat: Oropharynx is clear and moist. No oropharyngeal exudate.  Eyes: Conjunctivae and EOM are normal.  Neck: Normal range of motion. Neck supple.  Cardiovascular: Normal rate and normal heart sounds.   Pulmonary/Chest: Effort normal and breath sounds normal. No respiratory distress. She has no wheezes.  Musculoskeletal: She exhibits no edema and no tenderness.  Lymphadenopathy:    She has no cervical adenopathy.  Neurological: She is alert and oriented to person, place, and time. She exhibits normal muscle tone.  Skin: Skin is warm and dry.  Small, 1-56mm papules as described above. 8-10 on the L UE. Small crop on L shoulder, irregular in shape. Few to the RUE.No vessicles, drainage, bleeding  Psychiatric: She has a normal mood and affect.    ED Course  Procedures (including critical care time) Labs Reviewed - No data to display No results found. 1. Skin papules, generalized     MDM  I suspect these papules are either some sort of bug bite or a viral exanthem. She is asymptomatic otherwise. May continue to take Benadryl since  this helps. 3 new symptoms problems or worsening may return.  [name], NP 02/27/13 1121

## 2013-02-27 NOTE — ED Provider Notes (Signed)
Medical screening examination/treatment/procedure(s) were performed by non-physician practitioner and as supervising physician I was immediately available for consultation/collaboration.  Marketa Midkiff   Kwabena Strutz, MD 02/27/13 1253 

## 2013-03-09 ENCOUNTER — Emergency Department (HOSPITAL_COMMUNITY)
Admission: EM | Admit: 2013-03-09 | Discharge: 2013-03-09 | Disposition: A | Discharge status: Home / Self Care | Payer: Self-pay | Attending: Emergency Medicine | Admitting: Emergency Medicine

## 2013-03-09 DIAGNOSIS — B86 Scabies: Secondary | ICD-10-CM | POA: Insufficient documentation

## 2013-03-09 DIAGNOSIS — Z79899 Other long term (current) drug therapy: Secondary | ICD-10-CM | POA: Insufficient documentation

## 2013-03-09 MED ORDER — PERMETHRIN 5 % EX CREA: TOPICAL_CREAM | CUTANEOUS | Status: DC

## 2013-03-09 MED ORDER — HYDROXYZINE HCL 10 MG PO TABS
10.0000 mg | ORAL_TABLET | Freq: Once | ORAL | Status: AC
  Administered 2013-03-09: 10 mg via ORAL
  Filled 2013-03-09: qty 1

## 2013-03-09 NOTE — ED Notes (Signed)
Pt c/o fine itchy bumps to arms, legs, and now on stomach. Pt has fine papular bumps to arms, legs. Pt states areas are itchy. Pt c/o symptoms for the past 3 weeks. Pt with no acute distress. Pt ambulatory to exam room with steady gait.

## 2013-03-09 NOTE — ED Provider Notes (Signed)
  Medical screening examination/treatment/procedure(s) were performed by non-physician practitioner and as supervising physician I was immediately available for consultation/collaboration.    Laylanie Kruczek, MD 03/09/13 2346 

## 2013-03-09 NOTE — ED Provider Notes (Signed)
History    This chart was scribed for a non-physician practitioner working with Gerhard Munch, MD by Jiles Prows, ED scribe. This patient was seen in room WTR7/WTR7 and the patient's care was started at 9:24 PM.  CSN: [id number] Arrival date & time 03/09/13  1931    Chief Complaint  Patient presents with  . Rash     The history is provided by the patient and medical records. No language interpreter was used.   HPI Comments: Jodi Mendez is a 37 y.o. female who presents to the Emergency Department complaining of constant, moderately itchy bumps that resemble mosquito bites to legs, arms, and torso onset 3 weeks ago.  Pt reports that the bumps usually come in twos.  She states that she has been using alcohol to dry the bumps out.  She reports that no one else at home has the bumps.  Pt denies fever, chills, headache, neck pain, LOC, hitting head, CP, SOB, N/V/D, weakness, numbness, dizziness, syncope. 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   No past medical history on file. Past Surgical History  Procedure Laterality Date  . Laparoscopic unilateral salpingectomy Left 2007    Ectopic pregnancy   No family history on file. History  Substance Use Topics  . Smoking status: Never Smoker   . Smokeless tobacco: Never Used  . Alcohol Use: Yes     Comment: social   OB History   Grav Para Term Preterm Abortions TAB SAB Ect Mult Living   1 0   1   1       Review of Systems  Skin: Positive for rash.  All other systems reviewed and are negative.   Allergies  Sulfa antibiotics  Home Medications   Current Outpatient Rx  Name  Route  Sig  Dispense  Refill  . diphenhydrAMINE (BENADRYL) 25 MG tablet   Oral   Take 25 mg by mouth every 8 (eight) hours as needed for itching or allergies.         . ferrous sulfate 325 (65 FE) MG tablet   Oral   Take 325 mg by mouth daily with breakfast.         . megestrol (MEGACE) 40 MG tablet   Oral   Take 40 mg by mouth every  morning.         . Multiple Vitamin (MULTIVITAMIN WITH MINERALS) TABS   Oral   Take 1 tablet by mouth every morning.         . Probiotic Product (TRUBIOTICS) CAPS   Oral   Take 1 capsule by mouth every morning.         . permethrin (ELIMITE) 5 % cream      Apply to affected area once   60 g   1    BP 132/83  Pulse 62  Temp(Src) 98.9 F (37.2 C) (Oral)  Resp 16  SpO2 100%  LMP 01/28/2013 Physical Exam  Nursing note and vitals reviewed. Constitutional: She appears well-developed and well-nourished. No distress.  Awake, alert, nontoxic appearance  HENT:  Head: Normocephalic and atraumatic.  Mouth/Throat: Oropharynx is clear and moist. No oropharyngeal exudate.  Eyes: Conjunctivae are normal. No scleral icterus.  Neck: Normal range of motion. Neck supple.  Cardiovascular: Normal rate, regular rhythm and intact distal pulses.   Pulmonary/Chest: Effort normal and breath sounds normal. No respiratory distress. She has no wheezes.  Abdominal: Soft. Bowel sounds are normal. She exhibits no mass. There is no tenderness. There  is no rebound and no guarding.  Musculoskeletal: Normal range of motion. She exhibits no edema.  Neurological: She is alert.  Speech is clear and goal oriented Moves extremities without ataxia  Skin: Skin is warm and dry. Rash noted. She is not diaphoretic. There is erythema.  Diffuse erythematous papular, blanching rash with lesions on all extremities and trunk  Psychiatric: She has a normal mood and affect.    ED Course  Procedures (including critical care time) DIAGNOSTIC STUDIES: Oxygen Saturation is 100% on RA, normal by my interpretation.    COORDINATION OF CARE: 9:27 PM - Discussed ED treatment with pt at bedside including scabies treatment and pt agrees.  Advised for pt and mother (who lives in same home) to treat selves with lotion overnight and repeat treatment in one week. Advised to bag up her linens and leave them for three days before  washing in hot water.  Labs Reviewed - No data to display No results found. 1. Scabies     MDM  [name] presents with scabies.  Discussed diagnosis & treatment of scabies with patient.  They have been advised to followup with her primary care doctor 2 weeks after treatment.  They have also been advised to clean entire household including washing sheets and using R.I.D. spray in the car and on sofa.   The use of permethrin cream was discussed as well, they were told to use cream from head to toe & leave on for 8-12 hours.  They've been advised to repeat treatment if new eruptions occur. Patient verbalized understanding. I have also discussed reasons to return immediately to the ER.  Patient expresses understanding and agrees with plan.     Dahlia Client Abem Shaddix, PA-C 03/09/13 2147

## 2014-07-01 ENCOUNTER — Encounter (HOSPITAL_COMMUNITY): Payer: Self-pay | Admitting: Emergency Medicine

## 2015-09-28 ENCOUNTER — Emergency Department (HOSPITAL_COMMUNITY)
Admission: EM | Admit: 2015-09-28 | Discharge: 2015-09-28 | Disposition: A | Discharge status: Home / Self Care | Payer: BLUE CROSS/BLUE SHIELD | Attending: Emergency Medicine | Admitting: Emergency Medicine

## 2015-09-28 ENCOUNTER — Encounter (HOSPITAL_COMMUNITY): Payer: Self-pay | Admitting: Emergency Medicine

## 2015-09-28 DIAGNOSIS — X58XXXA Exposure to other specified factors, initial encounter: Secondary | ICD-10-CM | POA: Insufficient documentation

## 2015-09-28 DIAGNOSIS — Z79899 Other long term (current) drug therapy: Secondary | ICD-10-CM | POA: Diagnosis not present

## 2015-09-28 DIAGNOSIS — Y9389 Activity, other specified: Secondary | ICD-10-CM | POA: Diagnosis not present

## 2015-09-28 DIAGNOSIS — Y9289 Other specified places as the place of occurrence of the external cause: Secondary | ICD-10-CM | POA: Diagnosis not present

## 2015-09-28 DIAGNOSIS — H1131 Conjunctival hemorrhage, right eye: Secondary | ICD-10-CM | POA: Diagnosis not present

## 2015-09-28 DIAGNOSIS — S0501XA Injury of conjunctiva and corneal abrasion without foreign body, right eye, initial encounter: Secondary | ICD-10-CM | POA: Diagnosis not present

## 2015-09-28 DIAGNOSIS — Y998 Other external cause status: Secondary | ICD-10-CM | POA: Diagnosis not present

## 2015-09-28 DIAGNOSIS — S0591XA Unspecified injury of right eye and orbit, initial encounter: Secondary | ICD-10-CM | POA: Diagnosis present

## 2015-09-28 MED ORDER — FLUORESCEIN SODIUM 1 MG OP STRP
1.0000 | ORAL_STRIP | Freq: Once | OPHTHALMIC | Status: AC
  Administered 2015-09-28: 1 via OPHTHALMIC
  Filled 2015-09-28: qty 1

## 2015-09-28 MED ORDER — ERYTHROMYCIN 5 MG/GM OP OINT: TOPICAL_OINTMENT | OPHTHALMIC | Status: DC

## 2015-09-28 MED ORDER — TETRACAINE HCL 0.5 % OP SOLN
2.0000 [drp] | Freq: Once | OPHTHALMIC | Status: AC
  Administered 2015-09-28: 2 [drp] via OPHTHALMIC
  Filled 2015-09-28: qty 4

## 2015-09-28 NOTE — ED Provider Notes (Signed)
CSN: [id number]     Arrival date & time 09/28/15  1531 History  By signing my name below, I, Emmanuella Mensah, attest that this documentation has been prepared under the direction and in the presence of [facility], PA-C. Electronically Signed: Judithann Sauger, ED Scribe. 09/28/2015. 4:01 PM.    Chief Complaint  Patient presents with  . Eye Injury    R eye   The history is provided by the patient. No language interpreter was used.   HPI Comments: Jodi Mendez is a 40 y.o. female who presents to the Emergency Department complaining of gradually worsening constant right eye pain and redness s/p an eye poke that occurred approx. 13 hours ago. She denies that her eye became red immediately after the poke. No alleviating factors noted. Pt has not taken any medications for her symptoms. She denies any fever, chills, eye discharge, vision changes, or foreign object in eye.    History reviewed. No pertinent past medical history. Past Surgical History  Procedure Laterality Date  . Laparoscopic unilateral salpingectomy Left 2007    Ectopic pregnancy   History reviewed. No pertinent family history. Social History  Substance Use Topics  . Smoking status: Never Smoker   . Smokeless tobacco: Never Used  . Alcohol Use: Yes     Comment: social   OB History    Gravida Para Term Preterm AB TAB SAB Ectopic Multiple Living   1 0   1   1       Review of Systems  Constitutional: Negative for fever and chills.  Eyes: Positive for pain and redness. Negative for discharge and visual disturbance.    Allergies  Sulfa antibiotics  Home Medications   Prior to Admission medications   Medication Sig Start Date End Date Taking? Authorizing Provider  diphenhydrAMINE (BENADRYL) 25 MG tablet Take 25 mg by mouth every 8 (eight) hours as needed for itching or allergies.    Historical Provider, MD  erythromycin ophthalmic ointment Place a 1/2 inch ribbon of ointment into the lower eyelid four times  daily for the next 5 days. 09/28/15   Ozella Almond Zabrina Brotherton, PA-C  ferrous sulfate 325 (65 FE) MG tablet Take 325 mg by mouth daily with breakfast.    Historical Provider, MD  megestrol (MEGACE) 40 MG tablet Take 40 mg by mouth every morning.    Historical Provider, MD  Multiple Vitamin (MULTIVITAMIN WITH MINERALS) TABS Take 1 tablet by mouth every morning.    Historical Provider, MD  permethrin (ELIMITE) 5 % cream Apply to affected area once 03/09/13   Jarrett Soho Muthersbaugh, PA-C  Probiotic Product (TRUBIOTICS) CAPS Take 1 capsule by mouth every morning.    Historical Provider, MD   BP 147/94 mmHg  Pulse 57  Temp(Src) 98.2 F (36.8 C)  Resp 16  SpO2 100%  LMP 09/05/2015 (Approximate) Physical Exam  Constitutional: She is oriented to person, place, and time. She appears well-developed and well-nourished.  HENT:  Head: Normocephalic and atraumatic.  Eyes: EOM and lids are normal. Pupils are equal, round, and reactive to light. Lids are everted and swept, no foreign bodies found. Right conjunctiva has a hemorrhage. Left conjunctiva has no hemorrhage.  Slit lamp exam:      The right eye shows fluorescein uptake (as depicted in image).    Right eye erythematous consistent with subconjunctival hemorrhage. Corneal abrasion noted on fluorescein staining.   Cardiovascular: Normal rate.   Pulmonary/Chest: Effort normal.  Abdominal: She exhibits no distension.  Neurological: She is alert  and oriented to person, place, and time.  Skin: Skin is warm and dry.  Psychiatric: She has a normal mood and affect.  Nursing note and vitals reviewed.   ED Course  Procedures (including critical care time) DIAGNOSTIC STUDIES: Oxygen Saturation is 100% on RA, normal by my interpretation.    COORDINATION OF CARE: 4:00 PM- Pt advised of plan for treatment and pt agrees. Will use Fluorescin for further evaluation.    MDM   Final diagnoses:  Corneal abrasion, right, initial encounter  Subconjunctival bleed,  right   Pt with corneal abrasion on exam. No evidence of FB.  Exam not concerning for orbital cellulitis, hyphema. No concern for uveitis. Patient will be discharged home with erythromycin ointment. Patient understands to follow up with ophthalmology; return precautions discussed. Patient appears safe for discharged.   I personally performed the services described in this documentation, which was scribed in my presence. The recorded information has been reviewed and is accurate.  [facility] Maeson Purohit, PA-C 09/28/15 [place] Yao, MD 09/28/15 [phone]

## 2015-09-28 NOTE — ED Notes (Signed)
Pt with R eye injury at 0300. Pt poked in the eye. R eye red and painful.

## 2015-09-28 NOTE — Discharge Instructions (Signed)
Follow up with the eye doctor as discussed. Return to ER as needed.  Use antibiotic ointment as directed.

## 2016-04-20 ENCOUNTER — Encounter (HOSPITAL_COMMUNITY): Payer: Self-pay

## 2016-04-20 ENCOUNTER — Observation Stay (HOSPITAL_COMMUNITY)
Admission: AD | Admit: 2016-04-20 | Discharge: 2016-04-21 | Disposition: A | Discharge status: Home / Self Care | Payer: BLUE CROSS/BLUE SHIELD | Source: Ambulatory Visit | Attending: Family Medicine | Admitting: Family Medicine

## 2016-04-20 DIAGNOSIS — I10 Essential (primary) hypertension: Secondary | ICD-10-CM | POA: Insufficient documentation

## 2016-04-20 DIAGNOSIS — D62 Acute posthemorrhagic anemia: Secondary | ICD-10-CM

## 2016-04-20 DIAGNOSIS — D259 Leiomyoma of uterus, unspecified: Secondary | ICD-10-CM | POA: Diagnosis not present

## 2016-04-20 DIAGNOSIS — N920 Excessive and frequent menstruation with regular cycle: Principal | ICD-10-CM

## 2016-04-20 DIAGNOSIS — Z7982 Long term (current) use of aspirin: Secondary | ICD-10-CM | POA: Diagnosis not present

## 2016-04-20 DIAGNOSIS — E119 Type 2 diabetes mellitus without complications: Secondary | ICD-10-CM | POA: Diagnosis not present

## 2016-04-20 DIAGNOSIS — Z79899 Other long term (current) drug therapy: Secondary | ICD-10-CM | POA: Insufficient documentation

## 2016-04-20 HISTORY — DX: Anemia, unspecified: D64.9

## 2016-04-20 LAB — COMPREHENSIVE METABOLIC PANEL
ALBUMIN: 3.3 g/dL — AB (ref 3.5–5.0)
ALT: 19 U/L (ref 14–54)
AST: 16 U/L (ref 15–41)
Alkaline Phosphatase: 51 U/L (ref 38–126)
Anion gap: 6 (ref 5–15)
BUN: 14 mg/dL (ref 6–20)
CHLORIDE: 110 mmol/L (ref 101–111)
CO2: 21 mmol/L — ABNORMAL LOW (ref 22–32)
Calcium: 8 mg/dL — ABNORMAL LOW (ref 8.9–10.3)
Creatinine, Ser: 0.77 mg/dL (ref 0.44–1.00)
GFR calc Af Amer: >60 mL/min (ref >60)
GFR calc non Af Amer: >60 mL/min (ref >60)
GLUCOSE: 181 mg/dL — AB (ref 65–99)
POTASSIUM: 3.3 mmol/L — AB (ref 3.5–5.1)
Sodium: 137 mmol/L (ref 135–145)
Total Bilirubin: 0.7 mg/dL (ref 0.3–1.2)
Total Protein: 6.3 g/dL — ABNORMAL LOW (ref 6.5–8.1)

## 2016-04-20 LAB — CBC
HCT: 15.1 % — ABNORMAL LOW (ref 36.0–46.0)
Hemoglobin: 4.6 g/dL — CL (ref 12.0–15.0)
MCH: 21.3 pg — ABNORMAL LOW (ref 26.0–34.0)
MCHC: 30.5 g/dL (ref 30.0–36.0)
MCV: 69.9 fL — ABNORMAL LOW (ref 78.0–100.0)
PLATELETS: 363 10*3/uL (ref 150–400)
RBC: 2.16 MIL/uL — ABNORMAL LOW (ref 3.87–5.11)
RDW: 22.6 % — AB (ref 11.5–15.5)
WBC: 7.9 10*3/uL (ref 4.0–10.5)

## 2016-04-20 LAB — PREPARE RBC (CROSSMATCH)

## 2016-04-20 MED ORDER — DIPHENHYDRAMINE HCL 25 MG PO CAPS
25.0000 mg | ORAL_CAPSULE | Freq: Once | ORAL | Status: AC
  Administered 2016-04-20: 25 mg via ORAL
  Filled 2016-04-20: qty 1

## 2016-04-20 MED ORDER — ALUM & MAG HYDROXIDE-SIMETH 200-200-20 MG/5ML PO SUSP: 30.0000 mL | ORAL | Status: DC | PRN

## 2016-04-20 MED ORDER — DIPHENHYDRAMINE HCL 25 MG PO CAPS
25.0000 mg | ORAL_CAPSULE | Freq: Once | ORAL | Status: AC
  Filled 2016-04-20: qty 1

## 2016-04-20 MED ORDER — IBUPROFEN 600 MG PO TABS: 600.0000 mg | ORAL_TABLET | Freq: Four times a day (QID) | ORAL | Status: DC | PRN

## 2016-04-20 MED ORDER — MENTHOL 3 MG MT LOZG: 1.0000 | LOZENGE | OROMUCOSAL | Status: DC | PRN

## 2016-04-20 MED ORDER — HYDROCODONE-ACETAMINOPHEN 5-325 MG PO TABS: 1.0000 | ORAL_TABLET | ORAL | Status: DC | PRN

## 2016-04-20 MED ORDER — GUAIFENESIN 100 MG/5ML PO SOLN: 15.0000 mL | ORAL | Status: DC | PRN

## 2016-04-20 MED ORDER — SODIUM CHLORIDE 0.9 % IV BOLUS (SEPSIS)
1000.0000 mL | Freq: Once | INTRAVENOUS | Status: AC
  Administered 2016-04-20: 1000 mL via INTRAVENOUS

## 2016-04-20 MED ORDER — ACETAMINOPHEN 325 MG PO TABS
650.0000 mg | ORAL_TABLET | Freq: Once | ORAL | Status: AC
  Administered 2016-04-20: 650 mg via ORAL
  Filled 2016-04-20: qty 2

## 2016-04-20 MED ORDER — SODIUM CHLORIDE 0.9 % IV SOLN
Freq: Once | INTRAVENOUS | Status: AC
  Administered 2016-04-20: 10 mL/h via INTRAVENOUS

## 2016-04-20 MED ORDER — MEGESTROL ACETATE 40 MG PO TABS
40.0000 mg | ORAL_TABLET | Freq: Two times a day (BID) | ORAL | Status: DC
  Administered 2016-04-20 – 2016-04-21 (×2): 40 mg via ORAL
  Filled 2016-04-20 (×2): qty 1

## 2016-04-20 NOTE — MAU Note (Signed)
Thinks she is dehydrated.  Has heavy menstrual cycles. Sometimes twice a month.  Started back a couple days ago.  Is heavy and passing clots. Been dizzy. Gets cold sweats.

## 2016-04-20 NOTE — Progress Notes (Signed)
CRITICAL VALUE ALERT  Critical value received:  Hemoglobin and hematocrit   Date of notification:  04/20/16  Time of notification:  [id number]  Critical value read back:Yes.    Nurse who received alert:  Nilda Riggs  MD notified (1st page):  Lattie Haw leftwich-kirby,cnm

## 2016-04-20 NOTE — Progress Notes (Signed)
Report called to Deliah Boston on [facility] Unit

## 2016-04-20 NOTE — MAU Note (Signed)
Assisted pt to rm via wc and with dressing change

## 2016-04-20 NOTE — H&P (Signed)
  Jodi Mendez is an 40 y.o. [id number] female.   Chief Complaint: heavy vaginal bleeding HPI: 2nd admission for this patient with heavy vaginal bleeding. Known fibroids. 1 prior ectopic pregnancy. No f/u after last admission.  History reviewed. No pertinent past medical history.  Past Surgical History:  Procedure Laterality Date  . LAPAROSCOPIC UNILATERAL SALPINGECTOMY Left 2007   Ectopic pregnancy    Family History  Problem Relation Age of Onset  . Diabetes Mother   . Hypertension Mother    Social History:  reports that she has never smoked. She has never used smokeless tobacco. She reports that she drinks alcohol. She reports that she does not use drugs.  Allergies:  Allergies  Allergen Reactions  . Sulfa Antibiotics Hives and Itching    Itching/hives    No current facility-administered medications on file prior to encounter.    Current Outpatient Prescriptions on File Prior to Encounter  Medication Sig Dispense Refill  . ferrous sulfate 325 (65 FE) MG tablet Take 325 mg by mouth daily with breakfast.    . Multiple Vitamin (MULTIVITAMIN WITH MINERALS) TABS Take 1 tablet by mouth every morning.    Marland Kitchen erythromycin ophthalmic ointment Place a 1/2 inch ribbon of ointment into the lower eyelid four times daily for the next 5 days. (Patient not taking: Reported on 04/20/2016) 1 g 0  . permethrin (ELIMITE) 5 % cream Apply to affected area once (Patient not taking: Reported on 04/20/2016) 60 g 1    A comprehensive review of systems was negative.  Blood pressure 104/55, pulse 70, temperature 97.7 F (36.5 C), temperature source Oral, resp. rate 17, height 5\' 3"  (1.6 m), weight 185 lb (83.9 kg), last menstrual period 04/17/2016, SpO2 100 %. General appearance: alert, cooperative and appears stated age Head: Normocephalic, without obvious abnormality, atraumatic Neck: supple, symmetrical, trachea midline Lungs: normal effort Heart: regular rate and rhythm Abdomen: soft,  non-tender; bowel sounds normal; no masses,  no organomegaly Extremities: extremities normal, atraumatic, no cyanosis or edema Skin: Skin color, texture, turgor normal. No rashes or lesions Neurologic: Grossly normal   Lab Results  Component Value Date   WBC 7.9 04/20/2016   HGB 4.6 (LL) 04/20/2016   HCT 15.1 (L) 04/20/2016   MCV 69.9 (L) 04/20/2016   PLT 363 04/20/2016   Lab Results  Component Value Date   PREGTESTUR NEGATIVE 02/08/2013     Assessment/Plan Acute blood loss anemia  Menorrhagia with regular cycle  Leiomyoma of uterus  For admission and transfusion of 4 u PRBCs with premedication. Megace to stop her bleeding Outpatient f/u for pap and EMB.      Jodi Mendez 04/20/2016, 8:03 PM

## 2016-04-21 DIAGNOSIS — D62 Acute posthemorrhagic anemia: Secondary | ICD-10-CM | POA: Diagnosis not present

## 2016-04-21 LAB — CBC
HCT: 27.3 % — ABNORMAL LOW (ref 36.0–46.0)
Hemoglobin: 9 g/dL — ABNORMAL LOW (ref 12.0–15.0)
MCH: 25 pg — AB (ref 26.0–34.0)
MCHC: 33 g/dL (ref 30.0–36.0)
MCV: 75.8 fL — ABNORMAL LOW (ref 78.0–100.0)
Platelets: 260 10*3/uL (ref 150–400)
RBC: 3.6 MIL/uL — ABNORMAL LOW (ref 3.87–5.11)
RDW: 19.5 % — AB (ref 11.5–15.5)
WBC: 8.1 10*3/uL (ref 4.0–10.5)

## 2016-04-21 MED ORDER — MEGESTROL ACETATE 40 MG PO TABS: 40.0000 mg | ORAL_TABLET | Freq: Two times a day (BID) | ORAL | 3 refills | Status: DC

## 2016-04-21 NOTE — Discharge Summary (Signed)
Physician Discharge Summary  Patient ID: Jodi Mendez MRN: [id number] DOB/AGE: [birth year] 40 y.o.  Admit date: 04/20/2016 Discharge date: 04/21/2016   Discharge Diagnoses:  Active Problems:   Acute blood loss anemia   Menorrhagia   Leiomyoma of uterus   Consults: None  Significant Diagnostic Studies: CBC    Component Value Date/Time   WBC 7.9 04/20/2016 1827   RBC 2.16 (L) 04/20/2016 1827   HGB 4.6 (LL) 04/20/2016 1827   HCT 15.1 (L) 04/20/2016 1827   PLT 363 04/20/2016 1827   MCV 69.9 (L) 04/20/2016 1827   MCH 21.3 (L) 04/20/2016 1827   MCHC 30.5 04/20/2016 1827   RDW 22.6 (H) 04/20/2016 1827   LYMPHSABS 1.3 02/08/2013 1945   MONOABS 0.3 02/08/2013 1945   EOSABS 0.1 02/08/2013 1945   BASOSABS 0.1 02/08/2013 1945     Hospital Course: patient came to the hospital not feeling well and noted to have a Hgb of 4. Had episode of heavy vaginal bleeding in weeks prior to this. She has been admitted previously with similar symptoms. She failed to keep f/u appointments. She has previously had pelvic sonography that revealed fibroid uterus. No recent pap or EMB. She was admitted as an observation and received 4 u PRBC's. She tolerated transfusion well. She was started on Megace and vaginal bleeding slowed. Post-transfusion Hgb is pending.  Discharge exam: Physical Examination: General appearance - alert, well appearing, and in no distress Mental status - alert, oriented to person, place, and time Chest - normal effort Abdomen - soft, nontender, nondistended, no masses or organomegaly Extremities - Homan's sign negative bilaterally   Disposition: 01-Home or Self Care  Discharged Condition: improved  Discharge Instructions    Call MD for:  extreme fatigue    Complete by:  As directed   Call MD for:  persistant nausea and vomiting    Complete by:  As directed   Call MD for:  severe uncontrolled pain    Complete by:  As directed   Call MD for:  temperature >100.4     Complete by:  As directed   Diet - low sodium heart healthy    Complete by:  As directed   Increase activity slowly    Complete by:  As directed       Medication List    STOP taking these medications   erythromycin ophthalmic ointment   permethrin 5 % cream Commonly known as:  ELIMITE     TAKE these medications   ADVIL PM 200-25 MG Caps Generic drug:  Ibuprofen-Diphenhydramine HCl Take 2 capsules by mouth daily as needed (For pain.).   BC HEADACHE POWDER PO Take 1 packet by mouth daily as needed (For pain.).   ferrous sulfate 325 (65 FE) MG tablet Take 325 mg by mouth daily with breakfast.   megestrol 40 MG tablet Commonly known as:  MEGACE Take 1 tablet (40 mg total) by mouth 2 (two) times daily.   multivitamin with minerals Tabs tablet Take 1 tablet by mouth every morning.      Follow-up [place] for [facility] .   Specialty:  Obstetrics and Gynecology Contact information: [address] [place] [phone]          Signed: [name] 04/21/2016, 9:50 AM

## 2016-04-21 NOTE — Progress Notes (Signed)
Pt  Teaching complete    Ready for d/c  Ambulate out

## 2016-04-21 NOTE — Discharge Instructions (Signed)

## 2016-04-22 LAB — TYPE AND SCREEN
ABO/RH(D): O POS
ANTIBODY SCREEN: NEGATIVE
UNIT DIVISION: 0
UNIT DIVISION: 0
UNIT DIVISION: 0
Unit division: 0
Unit division: 0
Unit division: 0
Unit division: 0

## 2016-05-24 ENCOUNTER — Other Ambulatory Visit (HOSPITAL_COMMUNITY)
Admission: RE | Admit: 2016-05-24 | Discharge: 2016-05-24 | Disposition: A | Discharge status: Home / Self Care | Payer: BLUE CROSS/BLUE SHIELD | Source: Ambulatory Visit | Attending: Family Medicine | Admitting: Family Medicine

## 2016-05-24 ENCOUNTER — Ambulatory Visit (INDEPENDENT_AMBULATORY_CARE_PROVIDER_SITE_OTHER): Payer: BLUE CROSS/BLUE SHIELD | Admitting: Family Medicine

## 2016-05-24 ENCOUNTER — Encounter: Payer: Self-pay | Admitting: Family Medicine

## 2016-05-24 VITALS — BP 140/86 | Ht 63.0 in | Wt 179.2 lb

## 2016-05-24 DIAGNOSIS — D259 Leiomyoma of uterus, unspecified: Secondary | ICD-10-CM

## 2016-05-24 DIAGNOSIS — Z3141 Encounter for fertility testing: Secondary | ICD-10-CM

## 2016-05-24 DIAGNOSIS — Z124 Encounter for screening for malignant neoplasm of cervix: Secondary | ICD-10-CM

## 2016-05-24 DIAGNOSIS — D62 Acute posthemorrhagic anemia: Secondary | ICD-10-CM | POA: Diagnosis not present

## 2016-05-24 DIAGNOSIS — N920 Excessive and frequent menstruation with regular cycle: Secondary | ICD-10-CM | POA: Diagnosis present

## 2016-05-24 LAB — CBC
HEMATOCRIT: 28.2 % — AB (ref 35.0–45.0)
HEMOGLOBIN: 8.4 g/dL — AB (ref 11.7–15.5)
MCH: 22.8 pg — AB (ref 27.0–33.0)
MCHC: 29.8 g/dL — AB (ref 32.0–36.0)
MCV: 76.6 fL — AB (ref 80.0–100.0)
MPV: 9.9 fL (ref 7.5–12.5)
Platelets: 364 10*3/uL (ref 140–400)
RBC: 3.68 MIL/uL — AB (ref 3.80–5.10)
RDW: 22.1 % — ABNORMAL HIGH (ref 11.0–15.0)
WBC: 5 10*3/uL (ref 3.8–10.8)

## 2016-05-24 LAB — POCT PREGNANCY, URINE: Preg Test, Ur: NEGATIVE

## 2016-05-24 NOTE — Assessment & Plan Note (Signed)
Options discussed including HTA, IUD, hysterectomy. No option is good as long as she is pursuing fertility. Will consider options post decision around that. Stay on Megace to stop bleeding, increase dose to 3x/day if needed to control bleeding.

## 2016-05-24 NOTE — Progress Notes (Signed)
Subjective:    Patient ID: Jodi Mendez is a 40 y.o. female presenting with Menorrhagia  on 05/24/2016  HPI: Here today for hospital f/u. Previously admitted x 2 for acute anemia requiring blood transfusion x 2. Hgb in the 3-4 range. She failed to keep outpatient follow-up and is here for that. She had an u/s 3 years ago that showed a fibroid uterus. She has been on Megace and iron, both of which she is taking sporadically. Could not remember to take it 2x/day. Continues to spot. Less cramping. She desires to have a child and has no idea how to do that. She is in a same sex relationship and her partner has 4 kids, but she wants one of her own.  Review of Systems  Constitutional: Negative for chills and fever.  Respiratory: Negative for shortness of breath.   Cardiovascular: Negative for chest pain.  Gastrointestinal: Negative for abdominal pain, nausea and vomiting.  Genitourinary: Positive for menstrual problem and vaginal bleeding. Negative for dysuria.  Skin: Negative for rash.      Objective:    BP 140/86 (BP Location: Left Arm, Patient Position: Sitting, Cuff Size: Normal)   Ht 5\' 3"  (1.6 m)   Wt 179 lb 3.2 oz (81.3 kg)   BMI 31.74 kg/m  Physical Exam  Constitutional: She is oriented to person, place, and time. She appears well-developed and well-nourished. No distress.  HENT:  Head: Normocephalic and atraumatic.  Eyes: No scleral icterus.  Neck: Neck supple.  Cardiovascular: Normal rate.   Pulmonary/Chest: Effort normal.  Abdominal: Soft.  Genitourinary:  Genitourinary Comments: BUS normal, vagina is pink and rugated, cervix is nulliparous without lesion, uterus is 10-12 wk size with fibroids noted to left and right and posteriorly, no adnexal mass or tenderness.   Neurological: She is alert and oriented to person, place, and time.  Skin: Skin is warm and dry.  Psychiatric: She has a normal mood and affect.   Procedure: Patient given informed consent, signed  copy in the chart, time out was performed. Appropriate time out taken. . The patient was placed in the lithotomy position and the cervix brought into view with sterile speculum.  Portio of cervix cleansed x 2 with betadine swabs.  A tenaculum was placed in the anterior lip of the cervix.  The uterus was sounded for depth of 9.5 cm. A pipelle was introduced to into the uterus, suction created,  and an endometrial sample was obtained. Mostly blood noted and pipelle passed 3 times. All equipment was removed and accounted for.  The patient tolerated the procedure well.     Assessment & Plan:   Problem List Items Addressed This Visit      Unprioritized   Acute blood loss anemia    Check hgb. Stay on iron for now.      Relevant Orders   CBC   Menorrhagia    Check TSH.      Relevant Orders   Surgical pathology   TSH   Leiomyoma of uterus - Primary    Options discussed including HTA, IUD, hysterectomy. No option is good as long as she is pursuing fertility. Will consider options post decision around that. Stay on Megace to stop bleeding, increase dose to 3x/day if needed to control bleeding.       Other Visit Diagnoses    Screening for cervical cancer       Relevant Orders   Cytology - PAP   Fertility testing  Will refer to Dr. Kerin Perna with [facility] for discussion of fertility options. She is 40 and has fibroids and in a same sex relationship.   Relevant Orders   Anti mullerian hormone   Ambulatory referral to Endocrinology      Total face-to-face time with patient: 30 minutes. Over 50% of encounter was spent on counseling and coordination of care. Return in about 3 months (around 08/23/2016) for a follow-up.  [name] 05/24/2016 10:40 AM

## 2016-05-24 NOTE — Assessment & Plan Note (Signed)
Check TSH 

## 2016-05-24 NOTE — Patient Instructions (Signed)
Uterine Fibroids Uterine fibroids are tissue masses (tumors) that can develop in the womb (uterus). They are also called leiomyomas. This type of tumor is not cancerous (benign) and does not spread to other parts of the body outside of the pelvic area, which is between the hip bones. Occasionally, fibroids may develop in the fallopian tubes, in the cervix, or on the support structures (ligaments) that surround the uterus. You can have one or many fibroids. Fibroids can vary in size, weight, and where they grow in the uterus. Some can become quite large. Most fibroids do not require medical treatment. CAUSES A fibroid can develop when a single uterine cell keeps growing (replicating). Most cells in the human body have a control mechanism that keeps them from replicating without control. SIGNS AND SYMPTOMS Symptoms may include:   Heavy bleeding during your period.  Bleeding or spotting between periods.  Pelvic pain and pressure.  Bladder problems, such as needing to urinate more often (urinary frequency) or urgently.  Inability to reproduce offspring (infertility).  Miscarriages. DIAGNOSIS Uterine fibroids are diagnosed through a physical exam. Your health care provider may feel the lumpy tumors during a pelvic exam. Ultrasonography and an MRI may be done to determine the size, location, and number of fibroids. TREATMENT Treatment may include:  Watchful waiting. This involves getting the fibroid checked by your health care provider to see if it grows or shrinks. Follow your health care provider's recommendations for how often to have this checked.  Hormone medicines. These can be taken by mouth or given through an intrauterine device (IUD).  Surgery.  Removing the fibroids (myomectomy) or the uterus (hysterectomy).  Removing blood supply to the fibroids (uterine artery embolization). If fibroids interfere with your fertility and you want to become pregnant, your health care provider  may recommend having the fibroids removed.  HOME CARE INSTRUCTIONS  Keep all follow-up visits as directed by your health care provider. This is important.  Take medicines only as directed by your health care provider.  If you were prescribed a hormone treatment, take the hormone medicines exactly as directed.  Do not take aspirin, because it can cause bleeding.  Ask your health care provider about taking iron pills and increasing the amount of dark green, leafy vegetables in your diet. These actions can help to boost your blood iron levels, which may be affected by heavy menstrual bleeding.  Pay close attention to your period and tell your health care provider about any changes, such as:  Increased blood flow that requires you to use more pads or tampons than usual per month.  A change in the number of days that your period lasts per month.  A change in symptoms that are associated with your period, such as abdominal cramping or back pain. SEEK MEDICAL CARE IF:  You have pelvic pain, back pain, or abdominal cramps that cannot be controlled with medicines.  You have an increase in bleeding between and during periods.  You soak tampons or pads in a half hour or less.  You feel lightheaded, extra tired, or weak. SEEK IMMEDIATE MEDICAL CARE IF:  You faint.  You have a sudden increase in pelvic pain.   This information is not intended to replace advice given to you by your health care provider. Make sure you discuss any questions you have with your health care provider.   Document Released: 08/13/2000 Document Revised: 09/06/2014 Document Reviewed: 02/12/2014 Elsevier Interactive Patient Education 2016 Elsevier [employer] Menorrhagia Menorrhagia is the medical term for  when your menstrual periods are heavy or last longer than usual. With menorrhagia, every period you have may cause enough blood loss and cramping that you are unable to maintain your usual activities. CAUSES  In some  cases, the cause of heavy periods is unknown, but a number of conditions may cause menorrhagia. Common causes include:  A problem with the hormone-producing thyroid gland (hypothyroid).  Noncancerous growths in the uterus (polyps or fibroids).  An imbalance of the estrogen and progesterone hormones.  One of your ovaries not releasing an egg during one or more months.  Side effects of having an intrauterine device (IUD).  Side effects of some medicines, such as anti-inflammatory medicines or blood thinners.  A bleeding disorder that stops your blood from clotting normally. SIGNS AND SYMPTOMS  During a normal period, bleeding lasts between 4 and 8 days. Signs that your periods are too heavy include:  You routinely have to change your pad or tampon every 1 or 2 hours because it is completely soaked.  You pass blood clots larger than 1 inch (2.5 cm) in size.  You have bleeding for more than 7 days.  You need to use pads and tampons at the same time because of heavy bleeding.  You need to wake up to change your pads or tampons during the night.  You have symptoms of anemia, such as tiredness, fatigue, or shortness of breath. DIAGNOSIS  Your health care provider will perform a physical exam and ask you questions about your symptoms and menstrual history. Other tests may be ordered based on what the health care provider finds during the exam. These tests can include:  Blood tests. Blood tests are used to check if you are pregnant or have hormonal changes, a bleeding or thyroid disorder, low iron levels (anemia), or other problems.  Endometrial biopsy. Your health care provider takes a sample of tissue from the inside of your uterus to be examined under a microscope.  Pelvic ultrasound. This test uses sound waves to make a picture of your uterus, ovaries, and vagina. The pictures can show if you have fibroids or other growths.  Hysteroscopy. For this test, your health care provider  will use a small telescope to look inside your uterus. Based on the results of your initial tests, your health care provider may recommend further testing. TREATMENT  Treatment may not be needed. If it is needed, your health care provider may recommend treatment with one or more medicines first. If these do not reduce bleeding enough, a surgical treatment might be an option. The best treatment for you will depend on:   Whether you need to prevent pregnancy.  Your desire to have children in the future.  The cause and severity of your bleeding.  Your opinion and personal preference.  Medicines for menorrhagia may include:  Birth control methods that use hormones. These include the pill, skin patch, vaginal ring, shots that you get every 3 months, hormonal IUD, and implant. These treatments reduce bleeding during your menstrual period.  Medicines that thicken blood and slow bleeding.  Medicines that reduce swelling, such as ibuprofen.  Medicines that contain a synthetic hormone called progestin.   Medicines that make the ovaries stop working for a short time.  You may need surgical treatment for menorrhagia if the medicines are unsuccessful. Treatment options include:  Dilation and curettage (D&C). In this procedure, your health care provider opens (dilates) your cervix and then scrapes or suctions tissue from the lining of your uterus to  reduce menstrual bleeding.  Operative hysteroscopy. This procedure uses a tiny tube with a light (hysteroscope) to view your uterine cavity and can help in the surgical removal of a polyp that may be causing heavy periods.  Endometrial ablation. Through various techniques, your health care provider permanently destroys the entire lining of your uterus (endometrium). After endometrial ablation, most women have little or no menstrual flow. Endometrial ablation reduces your ability to become pregnant.  Endometrial resection. This surgical procedure  uses an electrosurgical wire loop to remove the lining of the uterus. This procedure also reduces your ability to become pregnant.  Hysterectomy. Surgical removal of the uterus and cervix is a permanent procedure that stops menstrual periods. Pregnancy is not possible after a hysterectomy. This procedure requires anesthesia and hospitalization. HOME CARE INSTRUCTIONS   Only take over-the-counter or prescription medicines as directed by your health care provider. Take prescribed medicines exactly as directed. Do not change or switch medicines without consulting your health care provider.  Take any prescribed iron pills exactly as directed by your health care provider. Long-term heavy bleeding may result in low iron levels. Iron pills help replace the iron your body lost from heavy bleeding. Iron may cause constipation. If this becomes a problem, increase the bran, fruits, and roughage in your diet.  Do not take aspirin or medicines that contain aspirin 1 week before or during your menstrual period. Aspirin may make the bleeding worse.  If you need to change your sanitary pad or tampon more than once every 2 hours, stay in bed and rest as much as possible until the bleeding stops.  Eat well-balanced meals. Eat foods high in iron. Examples are leafy green vegetables, meat, liver, eggs, and whole grain breads and cereals. Do not try to lose weight until the abnormal bleeding has stopped and your blood iron level is back to normal. SEEK MEDICAL CARE IF:   You soak through a pad or tampon every 1 or 2 hours, and this happens every time you have a period.  You need to use pads and tampons at the same time because you are bleeding so much.  You need to change your pad or tampon during the night.  You have a period that lasts for more than 8 days.  You pass clots bigger than 1 inch wide.  You have irregular periods that happen more or less often than once a month.  You feel dizzy or faint.  You  feel very weak or tired.  You feel short of breath or feel your heart is beating too fast when you exercise.  You have nausea and vomiting or diarrhea while you are taking your medicine.  You have any problems that may be related to the medicine you are taking. SEEK IMMEDIATE MEDICAL CARE IF:   You soak through 4 or more pads or tampons in 2 hours.  You have any bleeding while you are pregnant. MAKE SURE YOU:   Understand these instructions.  Will watch your condition.  Will get help right away if you are not doing well or get worse.   This information is not intended to replace advice given to you by your health care provider. Make sure you discuss any questions you have with your health care provider.   Document Released: 08/16/2005 Document Revised: 08/21/2013 Document Reviewed: 02/04/2013 Elsevier Interactive Patient Education [employer].

## 2016-05-24 NOTE — Assessment & Plan Note (Signed)
Check hgb. Stay on iron for now.

## 2016-05-25 LAB — TSH: TSH: 2 m[IU]/L

## 2016-05-26 LAB — CYTOLOGY - PAP

## 2016-05-27 LAB — ANTI MULLERIAN HORMONE: AMH AssessR: 0.05 ng/mL

## 2017-04-07 ENCOUNTER — Emergency Department (HOSPITAL_COMMUNITY)
Admission: EM | Admit: 2017-04-07 | Discharge: 2017-04-07 | Discharge status: Absent without leave | Payer: BLUE CROSS/BLUE SHIELD

## 2018-02-23 ENCOUNTER — Encounter (HOSPITAL_COMMUNITY): Payer: Self-pay | Admitting: Family Medicine

## 2018-02-23 ENCOUNTER — Ambulatory Visit (HOSPITAL_COMMUNITY)
Admission: EM | Admit: 2018-02-23 | Discharge: 2018-02-23 | Disposition: A | Discharge status: Home / Self Care | Payer: BLUE CROSS/BLUE SHIELD | Attending: Family Medicine | Admitting: Family Medicine

## 2018-02-23 DIAGNOSIS — Z23 Encounter for immunization: Secondary | ICD-10-CM

## 2018-02-23 DIAGNOSIS — W450XXA Nail entering through skin, initial encounter: Secondary | ICD-10-CM

## 2018-02-23 DIAGNOSIS — S91331A Puncture wound without foreign body, right foot, initial encounter: Secondary | ICD-10-CM

## 2018-02-23 MED ORDER — DOXYCYCLINE HYCLATE 100 MG PO TABS: 100.0000 mg | ORAL_TABLET | Freq: Two times a day (BID) | ORAL | 0 refills | Status: DC

## 2018-02-23 MED ORDER — TETANUS-DIPHTH-ACELL PERTUSSIS 5-2.5-18.5 LF-MCG/0.5 IM SUSP
0.5000 mL | Freq: Once | INTRAMUSCULAR | Status: AC
  Administered 2018-02-23: 0.5 mL via INTRAMUSCULAR

## 2018-02-23 MED ORDER — TETANUS-DIPHTH-ACELL PERTUSSIS 5-2.5-18.5 LF-MCG/0.5 IM SUSP
INTRAMUSCULAR | Status: AC
  Filled 2018-02-23: qty 0.5

## 2018-02-23 NOTE — ED Triage Notes (Signed)
Pt sts right foot pain after stepping on nail 1 week ago

## 2018-02-23 NOTE — ED Provider Notes (Signed)
[place]   [id number] 02/23/18 Arrival Time: 1528   SUBJECTIVE:  Jodi Mendez is a 42 y.o. female who presents to the urgent care with complaint of right foot pain after stepping on nail 1 week ago  She has had staph infection before and the discomfort in the foot, while not severe, is persistent and there is some tingling in the calf as well  Patient works in a warehouse.  Past Medical History:  Diagnosis Date  . Anemia    Family History  Problem Relation Age of Onset  . Diabetes Mother   . Hypertension Mother    Social History   Socioeconomic History  . Marital status: Divorced    Spouse name: Not on file  . Number of children: Not on file  . Years of education: Not on file  . Highest education level: Not on file  Occupational History  . Not on file  Social Needs  . Financial resource strain: Not on file  . Food insecurity:    Worry: Not on file    Inability: Not on file  . Transportation needs:    Medical: Not on file    Non-medical: Not on file  Tobacco Use  . Smoking status: Never Smoker  . Smokeless tobacco: Never Used  Substance and Sexual Activity  . Alcohol use: Yes    Comment: social  . Drug use: No  . Sexual activity: Never  Lifestyle  . Physical activity:    Days per week: Not on file    Minutes per session: Not on file  . Stress: Not on file  Relationships  . Social connections:    Talks on phone: Not on file    Gets together: Not on file    Attends religious service: Not on file    Active member of club or organization: Not on file    Attends meetings of clubs or organizations: Not on file    Relationship status: Not on file  . Intimate partner violence:    Fear of current or ex partner: Not on file    Emotionally abused: Not on file    Physically abused: Not on file    Forced sexual activity: Not on file  Other Topics Concern  . Not on file  Social History Narrative  . Not on file   No outpatient medications  have been marked as taking for the 02/23/18 encounter [facility] Encounter).   Allergies  Allergen Reactions  . Sulfa Antibiotics Hives and Itching    Itching/hives      ROS: As per HPI, remainder of ROS negative.   OBJECTIVE:   Vitals:   02/23/18 1616  BP: (!) 145/91  Pulse: 64  Resp: 18  Temp: 98.3 F (36.8 C)  TempSrc: Oral  SpO2: 100%     General appearance: alert; no distress Eyes: PERRL; EOMI; conjunctiva normal HENT: normocephalic; atraumatic;  oral mucosa normal Neck: supple Back: no CVA tenderness Extremities: no cyanosis or edema; symmetrical with no gross deformities Skin: warm and dry; mild plantar swelling overlying the plantar head of the fifth metatarsal. Neurologic: normal gait; grossly normal Psychological: alert and cooperative; normal mood and affect      Labs:  Results for orders placed or performed in visit on 05/24/16  CBC  Result Value Ref Range   WBC 5.0 3.8 - 10.8 K/uL   RBC 3.68 (L) 3.80 - 5.10 MIL/uL   Hemoglobin 8.4 (L) 11.7 - 15.5 g/dL   HCT 28.2 (L) 35.0 -  45.0 %   MCV 76.6 (L) 80.0 - 100.0 fL   MCH 22.8 (L) 27.0 - 33.0 pg   MCHC 29.8 (L) 32.0 - 36.0 g/dL   RDW 22.1 (H) 11.0 - 15.0 %   Platelets 364 140 - 400 K/uL   MPV 9.9 7.5 - 12.5 fL  TSH  Result Value Ref Range   TSH 2.00 mIU/L  Anti mullerian hormone  Result Value Ref Range   AMH AssessR 0.05 ng/mL  Pregnancy, urine POC  Result Value Ref Range   Preg Test, Ur NEGATIVE NEGATIVE  Cytology - PAP  Result Value Ref Range   CYTOLOGY - PAP PAP RESULT     Labs Reviewed - No data to display  No results found.     ASSESSMENT & PLAN:  1. Puncture wound of right foot, initial encounter     Meds ordered this encounter  Medications  . Tdap (BOOSTRIX) injection 0.5 mL  . doxycycline (VIBRA-TABS) 100 MG tablet    Sig: Take 1 tablet (100 mg total) by mouth 2 (two) times daily.    Dispense:  14 tablet    Refill:  0    Reviewed expectations re: course of current  medical issues. Questions answered. Outlined signs and symptoms indicating need for more acute intervention. Patient verbalized understanding. After Visit Summary given.    Procedures:      Robyn Haber, MD 02/23/18 1625

## 2018-02-23 NOTE — Discharge Instructions (Addendum)
Return if symptoms don't resolve over the next 7 days.

## 2018-03-30 ENCOUNTER — Telehealth (HOSPITAL_COMMUNITY): Payer: Self-pay | Admitting: *Deleted

## 2018-03-30 NOTE — Telephone Encounter (Signed)
Telephoned patient at home number and left message to return call to [facility] 

## 2018-03-31 ENCOUNTER — Other Ambulatory Visit: Payer: Self-pay | Admitting: Obstetrics and Gynecology

## 2018-03-31 DIAGNOSIS — Z1231 Encounter for screening mammogram for malignant neoplasm of breast: Secondary | ICD-10-CM

## 2018-06-15 ENCOUNTER — Ambulatory Visit (HOSPITAL_COMMUNITY): Payer: Self-pay

## 2018-06-26 ENCOUNTER — Other Ambulatory Visit (HOSPITAL_COMMUNITY): Payer: Self-pay | Admitting: *Deleted

## 2018-06-26 DIAGNOSIS — Z1231 Encounter for screening mammogram for malignant neoplasm of breast: Secondary | ICD-10-CM

## 2018-09-19 ENCOUNTER — Ambulatory Visit (HOSPITAL_COMMUNITY)
Admission: RE | Admit: 2018-09-19 | Discharge: 2018-09-19 | Disposition: A | Discharge status: Home / Self Care | Payer: Self-pay | Source: Ambulatory Visit | Attending: Obstetrics and Gynecology | Admitting: Obstetrics and Gynecology

## 2018-09-19 ENCOUNTER — Ambulatory Visit
Admission: RE | Admit: 2018-09-19 | Discharge: 2018-09-19 | Disposition: A | Discharge status: Home / Self Care | Payer: Self-pay | Source: Ambulatory Visit | Attending: Obstetrics and Gynecology | Admitting: Obstetrics and Gynecology

## 2018-09-19 ENCOUNTER — Encounter (HOSPITAL_COMMUNITY): Payer: Self-pay | Admitting: *Deleted

## 2018-09-19 ENCOUNTER — Encounter (HOSPITAL_COMMUNITY): Payer: Self-pay

## 2018-09-19 VITALS — BP 178/102 | Ht 63.0 in | Wt 171.0 lb

## 2018-09-19 DIAGNOSIS — Z1239 Encounter for other screening for malignant neoplasm of breast: Secondary | ICD-10-CM

## 2018-09-19 DIAGNOSIS — Z1231 Encounter for screening mammogram for malignant neoplasm of breast: Secondary | ICD-10-CM

## 2018-09-19 NOTE — Progress Notes (Signed)
No complaints today.   Pap Smear: Pap smear not completed today. Last Pap smear was 05/24/2016 at [facility] for [place] and normal with negative HPV. Per patient has a history of an abnormal Pap smear when she was 43 years old that she thinks a colposcopy was completed for follow-up. Patient states all Pap smears have been normal since colposcopy and has had more than three normal Pap smears. Last Pap smear result is in Epic.  Physical exam: Breasts Left breast slightly larger than right breast that per patient is normal for her. No skin abnormalities bilateral breasts. No nipple retraction bilateral breasts. No nipple discharge bilateral breasts. No lymphadenopathy. No lumps palpated bilateral breasts. No complaints of pain or tenderness on exam. Referred patient to the [place] for a screening mammogram. Appointment scheduled for Tuesday, September 19, 2018 at 1510.        Pelvic/Bimanual No Pap smear completed today since last Pap smear and HPV typing was 05/24/2016. Pap smear not indicated per [facility] guidelines.   Smoking History: Patient has never smoked.  Patient Navigation: Patient education provided. Access to services provided for patient through [facility] program.   Breast and Cervical Cancer Risk Assessment: Patient has no family history of breast cancer, known genetic mutations, or radiation treatment to the chest before age 51. Per patient has a history of cervical dysplasia. Patient has no history of being immunocompromised or DES exposure in-utero.  Risk Assessment    Risk Scores      09/19/2018   Last edited by: Armond Hang, LPN   5-year risk: 0.7 %   Lifetime risk: 9.5 %

## 2018-09-19 NOTE — Patient Instructions (Signed)
Explained breast self awareness with [name]. Patient did not need a Pap smear today due to last Pap smear and HPV typing was 9/25/20017. Let her know [facility] will cover Pap smears and HPV typing every 5 years unless has a history of abnormal Pap smears. Referred patient to the [place] for a screening mammogram. Appointment scheduled for Tuesday, September 19, 2018 at 1510Patient aware of appointment and will be there. Let patient know the [facility] will follow up with her within the next couple weeks with results of mammogram by letter or phone. [name] verbalized understanding.  Cassity Christian, Arvil Chaco, RN 2:40 PM

## 2018-10-05 ENCOUNTER — Other Ambulatory Visit: Payer: Self-pay

## 2018-10-05 ENCOUNTER — Inpatient Hospital Stay (HOSPITAL_COMMUNITY)
Admission: AD | Admit: 2018-10-05 | Discharge: 2018-10-06 | DRG: 812 | Disposition: A | Discharge status: Home / Self Care | Payer: Self-pay | Attending: Obstetrics and Gynecology | Admitting: Obstetrics and Gynecology

## 2018-10-05 ENCOUNTER — Inpatient Hospital Stay (HOSPITAL_COMMUNITY): Payer: Self-pay

## 2018-10-05 ENCOUNTER — Encounter (HOSPITAL_COMMUNITY): Payer: Self-pay | Admitting: Emergency Medicine

## 2018-10-05 DIAGNOSIS — N83292 Other ovarian cyst, left side: Secondary | ICD-10-CM | POA: Diagnosis present

## 2018-10-05 DIAGNOSIS — N179 Acute kidney failure, unspecified: Secondary | ICD-10-CM | POA: Diagnosis present

## 2018-10-05 DIAGNOSIS — N938 Other specified abnormal uterine and vaginal bleeding: Secondary | ICD-10-CM | POA: Diagnosis present

## 2018-10-05 DIAGNOSIS — I10 Essential (primary) hypertension: Secondary | ICD-10-CM | POA: Diagnosis present

## 2018-10-05 DIAGNOSIS — D219 Benign neoplasm of connective and other soft tissue, unspecified: Secondary | ICD-10-CM

## 2018-10-05 DIAGNOSIS — D250 Submucous leiomyoma of uterus: Secondary | ICD-10-CM | POA: Diagnosis present

## 2018-10-05 DIAGNOSIS — D649 Anemia, unspecified: Principal | ICD-10-CM | POA: Diagnosis present

## 2018-10-05 DIAGNOSIS — Z86018 Personal history of other benign neoplasm: Secondary | ICD-10-CM

## 2018-10-05 DIAGNOSIS — D259 Leiomyoma of uterus, unspecified: Secondary | ICD-10-CM | POA: Diagnosis present

## 2018-10-05 DIAGNOSIS — D62 Acute posthemorrhagic anemia: Secondary | ICD-10-CM | POA: Diagnosis present

## 2018-10-05 HISTORY — DX: Elevated blood-pressure reading, without diagnosis of hypertension: R03.0

## 2018-10-05 LAB — URINALYSIS, ROUTINE W REFLEX MICROSCOPIC
Bilirubin Urine: NEGATIVE
Glucose, UA: NEGATIVE mg/dL
Ketones, ur: NEGATIVE mg/dL
Leukocytes, UA: NEGATIVE
Nitrite: NEGATIVE
Protein, ur: NEGATIVE mg/dL
Specific Gravity, Urine: 1.011 (ref 1.005–1.030)
pH: 5 (ref 5.0–8.0)

## 2018-10-05 LAB — CBC WITH DIFFERENTIAL/PLATELET
Abs Immature Granulocytes: 0.04 10*3/uL (ref 0.00–0.07)
Basophils Absolute: 0 10*3/uL (ref 0.0–0.1)
Basophils Relative: 0 %
EOS ABS: 0 10*3/uL (ref 0.0–0.5)
Eosinophils Relative: 0 %
HCT: 10.8 % — ABNORMAL LOW (ref 36.0–46.0)
Hemoglobin: 2.7 g/dL — CL (ref 12.0–15.0)
Immature Granulocytes: 0 %
Lymphocytes Relative: 15 %
Lymphs Abs: 1.4 10*3/uL (ref 0.7–4.0)
MCH: 17.3 pg — AB (ref 26.0–34.0)
MCHC: 25 g/dL — ABNORMAL LOW (ref 30.0–36.0)
MCV: 69.2 fL — ABNORMAL LOW (ref 80.0–100.0)
MONOS PCT: 5 %
Monocytes Absolute: 0.5 10*3/uL (ref 0.1–1.0)
Neutro Abs: 7.3 10*3/uL (ref 1.7–7.7)
Neutrophils Relative %: 80 %
Platelets: 561 10*3/uL — ABNORMAL HIGH (ref 150–400)
RBC: 1.56 MIL/uL — ABNORMAL LOW (ref 3.87–5.11)
RDW: 30.9 % — ABNORMAL HIGH (ref 11.5–15.5)
WBC: 9.2 10*3/uL (ref 4.0–10.5)
nRBC: 0.7 % — ABNORMAL HIGH (ref 0.0–0.2)

## 2018-10-05 LAB — BASIC METABOLIC PANEL
ANION GAP: 12 (ref 5–15)
BUN: 9 mg/dL (ref 6–20)
CO2: 21 mmol/L — ABNORMAL LOW (ref 22–32)
Calcium: 8.8 mg/dL — ABNORMAL LOW (ref 8.9–10.3)
Chloride: 104 mmol/L (ref 98–111)
Creatinine, Ser: 1.1 mg/dL — ABNORMAL HIGH (ref 0.44–1.00)
GFR calc Af Amer: >60 mL/min (ref >60)
GFR calc non Af Amer: >60 mL/min (ref >60)
Glucose, Bld: 132 mg/dL — ABNORMAL HIGH (ref 70–99)
Potassium: 3.6 mmol/L (ref 3.5–5.1)
Sodium: 137 mmol/L (ref 135–145)

## 2018-10-05 LAB — PROTIME-INR
INR: 1.11
Prothrombin Time: 14.2 seconds (ref 11.4–15.2)

## 2018-10-05 LAB — I-STAT BETA HCG BLOOD, ED (MC, WL, AP ONLY): I-stat hCG, quantitative: <5 m[IU]/mL (ref <5)

## 2018-10-05 LAB — APTT: aPTT: 31 seconds (ref 24–36)

## 2018-10-05 LAB — TYPE AND SCREEN
ABO/RH(D): O POS
Antibody Screen: NEGATIVE

## 2018-10-05 LAB — PREPARE RBC (CROSSMATCH)

## 2018-10-05 MED ORDER — TRANEXAMIC ACID 650 MG PO TABS
1300.0000 mg | ORAL_TABLET | Freq: Three times a day (TID) | ORAL | Status: DC
  Filled 2018-10-05 (×4): qty 2

## 2018-10-05 MED ORDER — SODIUM CHLORIDE 0.9% IV SOLUTION
Freq: Once | INTRAVENOUS | Status: AC
  Administered 2018-10-05: 08:00:00 via INTRAVENOUS

## 2018-10-05 MED ORDER — NORETHINDRONE ACETATE 5 MG PO TABS
5.0000 mg | ORAL_TABLET | Freq: Two times a day (BID) | ORAL | Status: DC
  Filled 2018-10-05 (×3): qty 1

## 2018-10-05 MED ORDER — ONDANSETRON HCL 4 MG/2ML IJ SOLN
4.0000 mg | Freq: Once | INTRAMUSCULAR | Status: AC
  Administered 2018-10-05: 4 mg via INTRAVENOUS
  Filled 2018-10-05: qty 2

## 2018-10-05 MED ORDER — SODIUM CHLORIDE 0.9 % IV BOLUS
1000.0000 mL | Freq: Once | INTRAVENOUS | Status: AC
  Administered 2018-10-05: 1000 mL via INTRAVENOUS

## 2018-10-05 MED ORDER — SODIUM CHLORIDE 0.9% IV SOLUTION: Freq: Once | INTRAVENOUS | Status: DC

## 2018-10-05 MED ORDER — NORETHINDRONE ACETATE 5 MG PO TABS
5.0000 mg | ORAL_TABLET | Freq: Two times a day (BID) | ORAL | Status: DC
  Administered 2018-10-05 – 2018-10-06 (×2): 5 mg via ORAL
  Filled 2018-10-05 (×2): qty 1

## 2018-10-05 MED ORDER — ESTROGENS CONJUGATED 25 MG IJ SOLR
25.0000 mg | Freq: Once | INTRAMUSCULAR | Status: DC
  Filled 2018-10-05: qty 25

## 2018-10-05 MED ORDER — MEGESTROL ACETATE 40 MG PO TABS
120.0000 mg | ORAL_TABLET | Freq: Every day | ORAL | Status: DC
  Administered 2018-10-05: 120 mg via ORAL
  Filled 2018-10-05: qty 3

## 2018-10-05 MED ORDER — ONDANSETRON HCL 4 MG/2ML IJ SOLN: 4.0000 mg | Freq: Four times a day (QID) | INTRAMUSCULAR | Status: DC | PRN

## 2018-10-05 MED ORDER — ACETAMINOPHEN 325 MG PO TABS
650.0000 mg | ORAL_TABLET | Freq: Four times a day (QID) | ORAL | Status: DC | PRN
  Administered 2018-10-05 – 2018-10-06 (×2): 650 mg via ORAL
  Filled 2018-10-05 (×2): qty 2

## 2018-10-05 MED ORDER — ONDANSETRON HCL 4 MG PO TABS: 4.0000 mg | ORAL_TABLET | Freq: Four times a day (QID) | ORAL | Status: DC | PRN

## 2018-10-05 NOTE — ED Notes (Signed)
[facility] called for patient transport to [facility]

## 2018-10-05 NOTE — Plan of Care (Signed)
Admitted for blood transfusion for severe anemia.

## 2018-10-05 NOTE — ED Provider Notes (Signed)
0715: Patient handed off to me by previous ED PA at shift change pending blood transfusion and transfer to [facility] for further care.  Please see previous note for full details.  Briefly, patient presents with menorrhagia for several days.  Associated with generalized weakness, shortness of breath.  Hemoglobin found to be 2.5.  Menorrhagia has slowed down.  Hemodynamically stable.  I have reviewed labs, vital signs, EKG and otherwise unremarkable.  Plan is to finish 2 units packed RBCs and transferred to [facility] with 2 more units hanging.  ED PA will place orders.  Dr. Ilda Basset will be admitting. Physical Exam  BP 128/70   Pulse 82   Temp 97.6 F (36.4 C) (Oral)   Resp (!) 23   Ht 5\' 3"  (1.6 m)   Wt 79.4 kg   LMP 09/30/2018 (Exact Date)   SpO2 100%   BMI 31.00 kg/m   Physical Exam Vitals signs and nursing note reviewed.  Constitutional:      General: She is not in acute distress.    Appearance: She is well-developed.     Comments: NAD.  HENT:     Head: Normocephalic and atraumatic.     Right Ear: External ear normal.     Left Ear: External ear normal.     Nose: Nose normal.  Eyes:     General: No scleral icterus.    Conjunctiva/sclera: Conjunctivae normal.  Neck:     Musculoskeletal: Normal range of motion and neck supple.  Cardiovascular:     Rate and Rhythm: Normal rate and regular rhythm.     Heart sounds: Normal heart sounds. No murmur.  Pulmonary:     Effort: Pulmonary effort is normal.     Breath sounds: Normal breath sounds. No wheezing.  Musculoskeletal: Normal range of motion.        General: No deformity.  Skin:    General: Skin is warm and dry.     Capillary Refill: Capillary refill takes less than 2 seconds.  Neurological:     Mental Status: She is alert and oriented to person, place, and time.  Psychiatric:        Behavior: Behavior normal.        Thought Content: Thought content normal.        Judgment: Judgment normal.     ED  Course/Procedures     Procedures  MDM   0900: Evaluated pt. Discussed plan to transfer after 2 units packed RBCs with 2 additional units hanging en route. She understands this plan and is in agreement. VSS.        Kinnie Feil, PA-C 10/05/18 1122    Ezequiel Essex, MD 10/05/18 2042

## 2018-10-05 NOTE — ED Triage Notes (Signed)
Pt presents with multiple complaints of heavy vaginal bleeding, nausea, weakness, and dehydration. Pt reports menstrual cycle has been going on since last Saturday 09/30/18. Pt reports these symptoms of heavy vaginal bleeding have been going on for the past 5-6 years. Pt reports the nausea, weakness, and dehydration has been going on for a week now.

## 2018-10-05 NOTE — ED Provider Notes (Addendum)
[facility] EMERGENCY DEPARTMENT Provider Note   CSN: [id number] Arrival date & time: 10/05/18  0359     History   Chief Complaint Chief Complaint  Patient presents with  . Multiple Complaints    HPI Jodi Mendez is a 43 y.o. female.  Patient to ED with complaint of generalized weakness, DOE and heavy flow vaginal bleeding that started 5 days ago, becoming lighter in the last 2 days. No syncope. No pain. She endorses history of uterine fibroids with irregular menses, usually heavy flow, as well as history of anemia requiring transfusion in the past for same. The last evaluation by OB/GYN was 3 years ago. No fever, vaginal discharge, urinary symptoms. She reports nausea with vomiting for the past 2 days.  The history is provided by the patient. No language interpreter was used.    Past Medical History:  Diagnosis Date  . Anemia     Patient Active Problem List   Diagnosis Date Noted  . Acute blood loss anemia 02/09/2013  . Menorrhagia 02/09/2013  . Leiomyoma of uterus 02/09/2013    Past Surgical History:  Procedure Laterality Date  . LAPAROSCOPIC UNILATERAL SALPINGECTOMY Left 2007   Ectopic pregnancy     OB History    Gravida  1   Para  0   Term      Preterm      AB  1   Living        SAB      TAB      Ectopic  1   Multiple      Live Births               Home Medications    Prior to Admission medications   Medication Sig Start Date End Date Taking? Authorizing Provider  Aspirin-Salicylamide-Caffeine (BC HEADACHE POWDER PO) Take 1 packet by mouth daily as needed (For pain.).    [provider]  doxycycline (VIBRA-TABS) 100 MG tablet Take 1 tablet (100 mg total) by mouth 2 (two) times daily. Patient not taking: Reported on 09/19/2018 02/23/18   Robyn Haber, MD  ferrous sulfate 325 (65 FE) MG tablet Take 325 mg by mouth daily with breakfast.    [provider]  Ibuprofen-Diphenhydramine HCl (ADVIL  PM) 200-25 MG CAPS Take 2 capsules by mouth daily as needed (For pain.).    [provider]  megestrol (MEGACE) 40 MG tablet Take 1 tablet (40 mg total) by mouth 2 (two) times daily. Patient not taking: Reported on 09/19/2018 04/21/16   [name], MD  Multiple Vitamin (MULTIVITAMIN WITH MINERALS) TABS Take 1 tablet by mouth every morning.    [provider]    Family History Family History  Problem Relation Age of Onset  . Diabetes Mother   . Hypertension Mother     Social History Social History   Tobacco Use  . Smoking status: Never Smoker  . Smokeless tobacco: Never Used  Substance Use Topics  . Alcohol use: Yes    Comment: social  . Drug use: No     Allergies   Sulfa antibiotics   Review of Systems Review of Systems  Constitutional: Negative for chills and fever.  HENT: Negative.   Respiratory: Positive for shortness of breath (DOE).   Cardiovascular: Negative.   Gastrointestinal: Positive for nausea and vomiting. Negative for abdominal pain.  Genitourinary: Positive for menstrual problem and vaginal bleeding. Negative for vaginal discharge.  Musculoskeletal: Negative.   Skin: Negative.   Neurological:  Positive for weakness. Negative for syncope.     Physical Exam Updated Vital Signs BP 125/68 (BP Location: Left Arm)   Pulse 80   Temp 97.6 F (36.4 C) (Oral)   Resp 16   Ht 5\' 3"  (1.6 m)   Wt 79.4 kg   LMP 09/30/2018 (Exact Date)   SpO2 100%   BMI 31.00 kg/m   Physical Exam Constitutional:      General: She is not in acute distress.    Appearance: She is well-developed.  HENT:     Head: Normocephalic.     Mouth/Throat:     Mouth: Mucous membranes are dry.  Eyes:     General: No scleral icterus.    Comments: Marked conjunctival pallor.  Neck:     Musculoskeletal: Normal range of motion and neck supple.  Cardiovascular:     Rate and Rhythm: Normal rate and regular rhythm.     Heart sounds: No murmur.  Pulmonary:      Effort: Pulmonary effort is normal.     Breath sounds: Normal breath sounds. No wheezing, rhonchi or rales.  Abdominal:     General: Bowel sounds are normal.     Palpations: Abdomen is soft.     Tenderness: There is no abdominal tenderness. There is no guarding or rebound.  Genitourinary:    Comments: Minimal cervical bleeding present. Firm uterus palpable, height midway to umbilicus, wide to right adnexa. Mild tenderness.  Musculoskeletal: Normal range of motion.  Skin:    General: Skin is warm and dry.     Findings: No rash.     Comments: Pale oral mucosa and nail beds  Neurological:     Mental Status: She is alert and oriented to person, place, and time.      ED Treatments / Results  Labs (all labs ordered are listed, but only abnormal results are displayed) Labs Reviewed  BASIC METABOLIC PANEL - Abnormal; Notable for the following components:      Result Value   CO2 21 (*)    Glucose, Bld 132 (*)    Creatinine, Ser 1.10 (*)    Calcium 8.8 (*)    All other components within normal limits  CBC WITH DIFFERENTIAL/PLATELET  URINALYSIS, ROUTINE W REFLEX MICROSCOPIC  I-STAT BETA HCG BLOOD, ED ([facility], [facility], [facility] ONLY)  TYPE AND SCREEN   Results for orders placed or performed during the hospital encounter of 21/30/86  Basic metabolic panel  Result Value Ref Range   Sodium 137 135 - 145 mmol/L   Potassium 3.6 3.5 - 5.1 mmol/L   Chloride 104 98 - 111 mmol/L   CO2 21 (L) 22 - 32 mmol/L   Glucose, Bld 132 (H) 70 - 99 mg/dL   BUN 9 6 - 20 mg/dL   Creatinine, Ser 1.10 (H) 0.44 - 1.00 mg/dL   Calcium 8.8 (L) 8.9 - 10.3 mg/dL   GFR calc non Af Amer >60 >60 mL/min   GFR calc Af Amer >60 >60 mL/min   Anion gap 12 5 - 15  CBC with Differential  Result Value Ref Range   WBC 9.2 4.0 - 10.5 K/uL   RBC 1.56 (L) 3.87 - 5.11 MIL/uL   Hemoglobin 2.7 (LL) 12.0 - 15.0 g/dL   HCT 10.8 (L) 36.0 - 46.0 %   MCV 69.2 (L) 80.0 - 100.0 fL   MCH 17.3 (L) 26.0 - 34.0 pg   MCHC 25.0 (L) 30.0 - 36.0  g/dL   RDW 30.9 (H) 11.5 - 15.5 %  Platelets 561 (H) 150 - 400 K/uL   nRBC 0.7 (H) 0.0 - 0.2 %   Neutrophils Relative % 80 %   Neutro Abs 7.3 1.7 - 7.7 K/uL   Lymphocytes Relative 15 %   Lymphs Abs 1.4 0.7 - 4.0 K/uL   Monocytes Relative 5 %   Monocytes Absolute 0.5 0.1 - 1.0 K/uL   Eosinophils Relative 0 %   Eosinophils Absolute 0.0 0.0 - 0.5 K/uL   Basophils Relative 0 %   Basophils Absolute 0.0 0.0 - 0.1 K/uL   RBC Morphology POLYCHROMASIA PRESENT    Immature Granulocytes 0 %   Abs Immature Granulocytes 0.04 0.00 - 0.07 K/uL  Protime-INR  Result Value Ref Range   Prothrombin Time 14.2 11.4 - 15.2 seconds   INR 1.11   APTT  Result Value Ref Range   aPTT 31 24 - 36 seconds  I-Stat Beta hCG blood, ED ([facility], [facility], [facility] only)  Result Value Ref Range   I-stat hCG, quantitative <5.0 <5 mIU/mL   Comment 3          Type and screen [facility]  Result Value Ref Range   ABO/RH(D) O POS    Antibody Screen NEG    Sample Expiration 10/08/2018    Unit Number [id number]    Blood Component Type RED CELLS,LR    Unit division 00    Status of Unit ALLOCATED    Transfusion Status OK TO TRANSFUSE    Crossmatch Result Compatible    Unit Number [id number]    Blood Component Type RBC LR PHER1    Unit division 00    Status of Unit ALLOCATED    Transfusion Status OK TO TRANSFUSE    Crossmatch Result Compatible    Unit Number [id number]    Blood Component Type RED CELLS,LR    Unit division 00    Status of Unit ALLOCATED    Transfusion Status OK TO TRANSFUSE    Crossmatch Result Compatible    Unit Number [id number]    Blood Component Type RED CELLS,LR    Unit division 00    Status of Unit ALLOCATED    Transfusion Status OK TO TRANSFUSE    Crossmatch Result Compatible   Prepare RBC  Result Value Ref Range   Order Confirmation ORDER PROCESSED BY BLOOD BANK   Prepare RBC  Result Value Ref Range   Order Confirmation      ORDER PROCESSED BY BLOOD  BANK Performed at [facility], [address], [place], [place] [address]   [facility]  Result Value Ref Range   ISSUE DATE / TIME 644034742595    Blood Product Unit Number [id number]    PRODUCT CODE O8416S06    Unit Type and Rh 5100    Blood Product Expiration Date 301601093235    ISSUE DATE / TIME [id number]    Blood Product Unit Number [id number]    Unit Type and Rh 5100    Blood Product Expiration Date 160737106269    ISSUE DATE / TIME [id number]    Blood Product Unit Number [id number]    PRODUCT CODE C7893Y10    Unit Type and Rh 5100    Blood Product Expiration Date 175102585277    ISSUE DATE / TIME [id number]    Blood Product Unit Number [id number]    PRODUCT CODE [id number]    Unit Type and Rh 5100    Blood Product Expiration Date 580998338250      EKG None  Radiology No  results found.  Procedures Procedures (including critical care time) CRITICAL CARE Performed by: Dewaine Oats   Total critical care time: 45 minutes  Critical care time was exclusive of separately billable procedures and treating other patients.  Critical care was necessary to treat or prevent imminent or life-threatening deterioration.  Critical care was time spent personally by me on the following activities: development of treatment plan with patient and/or surrogate as well as nursing, discussions with consultants, evaluation of patient's response to treatment, examination of patient, obtaining history from patient or surrogate, ordering and performing treatments and interventions, ordering and review of laboratory studies, ordering and review of radiographic studies, pulse oximetry and re-evaluation of patient's condition.  Medications Ordered in ED Medications  sodium chloride 0.9 % bolus 1,000 mL (has no administration in time range)  ondansetron (ZOFRAN) injection 4 mg (has no administration in time range)     Initial Impression / Assessment and Plan / ED  Course  I have reviewed the triage vital signs and the nursing notes.  Pertinent labs & imaging results that were available during my care of the patient were reviewed by me and considered in my medical decision making (see chart for details).     Patient to ED for evaluation of progressive generalized, mild weakness, DOE, heavy vaginal bleeding over the past week, improving now. History of heavy flow, irregular menses with h/o fibroids requiring transfusion.   Initial read hemoglobin 2.5. Redraw ordered to verify. The patient is awake, alert, normal blood pressure, no tachycardia. Suspect anemia, however, concern this is an erroneous value.  Patient updated on results. Transfusion of 2 units packed RBCs ordered. Anticipate admission.  Redraw CBC verifies hgb 2.7. Discussed patient's presentation with Dr. Elonda Husky ([facility]) who advises he will accept the patient in transfer to [facility] for further care given source of anemia are uterine fibroids. He recommends Premarin and Megace which have been ordered. He also recommends transfusing 2 units prior to transfer, and starting an additional 2 units for transfer as they will have to repeat her blood bank labs on her arrival there and there should be no interruption in transfusion. As it will take 2-3 hours for initial 2 units, Dr. Ilda Basset will be the accepting physician for transfer to [facility].   Patient updated on plan and agrees with admission and transfer.   Patient care signed out to Carmon Sails, PA-C, to observe until time of transfer and to initiate transfer orders.   Final Clinical Impressions(s) / ED Diagnoses   Final diagnoses:  None   1. Severe anemia 2. Dysfunctional uterine bleeding 3. History of fibroids.   ED Discharge Orders    None       Charlann Lange, PA-C 10/05/18 0650    Charlann Lange, PA-C 10/05/18 0715    Charlann Lange, PA-C 10/05/18 2025    Ezequiel Essex, MD 10/05/18 [phone]

## 2018-10-05 NOTE — H&P (Signed)
Obstetrics & Gynecology H&P   Date of Admission: 10/05/2018   Requesting Provider: [facility]  Primary OBGYN: [facility] Primary Care Provider: Patient, No Pcp Per  Reason for Admission: chronic anemia  History of Present Illness: Ms. Jeanmarie is a 43 y.o. G1P0010 (Patient's last menstrual period was 09/30/2018 (exact date).), with the above CC. PMHx is significant for ?HTN, fibroids, anemia with VB and need for transfusion.   Seen in 2017 by Dr. Kennon Rounds after VB hospitalization. Pap, embx, tsh were negative and pt desired to preserve fertility.  Patient came to ED this morning and she states that she just felt awful and didn't feel right. She was only having vaginal spotting and exam there just had scant blood in the vault and no active bleeding. Pt found to be severely anemic and d/w overnight GYN and they accepted the transfer.   Patient having only spotting and no more n/v and never any pain, fevers, chills.   ROS: A 12-point review of systems was performed and negative, except as stated in the above HPI.  OBGYN History: As per HPI. OB History  Gravida Para Term Preterm AB Living  1 0     1    SAB TAB Ectopic Multiple Live Births      1        # Outcome Date GA Lbr Len/2nd Weight Sex Delivery Anes PTL Lv  1 Ectopic             Periods: qmonth, regular, 7-10d, very heavy for three days, no real pain. Started to be heavy in her late 12s She is currently using nothing for contraception.    Past Medical History: Past Medical History:  Diagnosis Date  . Anemia     Past Surgical History: Past Surgical History:  Procedure Laterality Date  . UNILATERAL SALPINGECTOMY Left 2007   Ectopic pregnancy. suprapubic mini-lap    Family History:  Family History  Problem Relation Age of Onset  . Diabetes Mother   . Hypertension Mother     Social History:  Social History   Socioeconomic History  . Marital status: Divorced    Spouse name: Not on file  . Number of children: 0  .  Years of education: Not on file  . Highest education level: Some college, no degree  Occupational History  . Not on file  Social Needs  . Financial resource strain: Not on file  . Food insecurity:    Worry: Not on file    Inability: Not on file  . Transportation needs:    Medical: Yes    Non-medical: Yes  Tobacco Use  . Smoking status: Never Smoker  . Smokeless tobacco: Never Used  Substance and Sexual Activity  . Alcohol use: Yes    Comment: social  . Drug use: No  . Sexual activity: Not Currently    Comment: not within the last couple of months/sexual active with women  Lifestyle  . Physical activity:    Days per week: Not on file    Minutes per session: Not on file  . Stress: Not on file  Relationships  . Social connections:    Talks on phone: Not on file    Gets together: Not on file    Attends religious service: Not on file    Active member of club or organization: Not on file    Attends meetings of clubs or organizations: Not on file    Relationship status: Not on file  . Intimate partner violence:  Fear of current or ex partner: Not on file    Emotionally abused: Not on file    Physically abused: Not on file    Forced sexual activity: Not on file  Other Topics Concern  . Not on file  Social History Narrative  . Not on file    Allergy: Allergies  Allergen Reactions  . Sulfa Antibiotics Hives and Itching    Itching/hives    Current Outpatient Medications: Medications Prior to Admission  Medication Sig Dispense Refill Last Dose  . Multiple Vitamin (MULTIVITAMIN WITH MINERALS) TABS Take 1 tablet by mouth daily.    10/04/2018 at Unknown time  . doxycycline (VIBRA-TABS) 100 MG tablet Take 1 tablet (100 mg total) by mouth 2 (two) times daily. (Patient not taking: Reported on 09/19/2018) 14 tablet 0 Completed Course at Unknown time  . megestrol (MEGACE) 40 MG tablet Take 1 tablet (40 mg total) by mouth 2 (two) times daily. (Patient not taking: Reported on  09/19/2018) 60 tablet 3 Not Taking at Unknown time     Hospital Medications: Current Facility-Administered Medications  Medication Dose Route Frequency Provider Last Rate Last Dose  . 0.9 %  sodium chloride infusion (Manually program via Guardrails IV Fluids)   Intravenous Once Aletha Halim, MD      . acetaminophen (TYLENOL) tablet 650 mg  650 mg Oral Q6H PRN Aletha Halim, MD      . norethindrone (AYGESTIN) tablet 5 mg  5 mg Oral BID Aletha Halim, MD      . ondansetron [facility]) tablet 4 mg  4 mg Oral Q6H PRN Aletha Halim, MD       Or  . ondansetron (ZOFRAN) injection 4 mg  4 mg Intravenous Q6H PRN Aletha Halim, MD      . tranexamic acid (LYSTEDA) tablet 1,300 mg  1,300 mg Oral TID Aletha Halim, MD         Physical Exam:  Current Vital Signs 24h Vital Sign Ranges  T 98.9 F (37.2 C) Temp  Avg: 98.1 F (36.7 C)  Min: 97.6 F (36.4 C)  Max: 98.9 F (37.2 C)  BP 140/81 BP  Min: 105/59  Max: 144/81  HR 76 Pulse  Avg: 80  Min: 74  Max: 88  RR 20 Resp  Avg: 18  Min: 10  Max: 23  SaO2 100 % Room Air SpO2  Avg: 100 %  Min: 100 %  Max: 100 %       24 Hour I/O Current Shift I/O  Time Ins Outs No intake/output data recorded. 02/06 0701 - 02/06 1900 In: 1000  Out: -    Patient Vitals for the past 24 hrs:  BP Temp Temp src Pulse Resp SpO2 Height Weight  10/05/18 1101 140/81 98.9 F (37.2 C) Oral 76 20 100 % - -  10/05/18 0957 139/76 98 F (36.7 C) Oral 79 (!) 21 100 % - -  10/05/18 0945 129/74 - - 78 16 100 % - -  10/05/18 0934 128/68 98.2 F (36.8 C) Oral 77 10 100 % - -  10/05/18 0900 136/73 - - 81 - 100 % - -  10/05/18 0845 118/63 - - 79 - 100 % - -  10/05/18 0830 122/67 - - 80 - 100 % - -  10/05/18 0815 109/64 - - 83 - 100 % - -  10/05/18 0800 124/68 - - 88 - 100 % - -  10/05/18 0751 139/75 98 F (36.7 C) Oral 84 19 100 % - -  10/05/18 0745 129/75 - - 86 - 100 % - -  10/05/18 0737 (!) 144/81 98.1 F (36.7 C) Oral 85 19 100 % - -  10/05/18 0700  132/71 - - - 20 - - -  10/05/18 0630 128/70 - - 82 (!) 23 100 % - -  10/05/18 0615 129/69 - - 77 16 100 % - -  10/05/18 0600 - - - 78 - 100 % - -  10/05/18 0545 (!) 105/59 - - 80 - 100 % - -  10/05/18 0530 113/62 - - 74 - 100 % - -  10/05/18 0515 119/68 - - 76 - 100 % - -  10/05/18 0500 117/62 - - 75 - 100 % - -  10/05/18 0445 120/60 - - 81 - 100 % - -  10/05/18 0430 123/68 - - 80 - 100 % - -  10/05/18 0415 122/79 - - 80 - 100 % - -  10/05/18 0413 - - - - - - 5\' 3"  (1.6 m) 79.4 kg  10/05/18 0412 125/68 97.6 F (36.4 C) Oral 80 16 100 % - -    Body mass index is 31 kg/m. General appearance: Well nourished, well developed female in no acute distress.  Cardiovascular: S1, S2 normal, no murmur, rub or gallop, regular rate and rhythm Respiratory:  Clear to auscultation bilateral. Normal respiratory effort Abdomen: positive bowel sounds and no masses, hernias; diffusely non tender to palpation, non distended. Well healed suprapubic transverse skin incision Neuro/Psych:  Normal mood and affect.  Skin:  Warm and dry.  Extremities: no clubbing, cyanosis, or edema.   Pelvic exam: deferred Laboratory: Negative: PT/INR/PTT Recent Labs  Lab 10/05/18 0608  WBC 9.2  HGB 2.7*  HCT 10.8*  PLT 561*   Recent Labs  Lab 10/05/18 0444  NA 137  K 3.6  CL 104  CO2 21*  BUN 9  CREATININE 1.10*  CALCIUM 8.8*  GLUCOSE 132*   Recent Labs  Lab 10/05/18 0608  APTT 31  INR 1.11   Recent Labs  Lab 10/05/18 0442  ABORH O POS    Imaging:  none  Assessment: Ms. Auth is a 43 y.o. G1P0010 (Patient's last menstrual period was 09/30/2018 (exact date).) with severe anemia. Pt stable Plan: Continue with plan for 4U PRBCs. T&s updated. U/s ordered. D/w her re: options after hospitalization and will talk to her more after u/s is back. Start bid aygestin; pt received a dose of megace in the ED. SCDs, OOB. Regular diet. Repeat labs in AM. Suspect aki due to anemia.   Dispo: likely tomorrow.     Durene Romans MD Attending Center for [facility])

## 2018-10-06 DIAGNOSIS — N83292 Other ovarian cyst, left side: Secondary | ICD-10-CM | POA: Diagnosis present

## 2018-10-06 DIAGNOSIS — D250 Submucous leiomyoma of uterus: Secondary | ICD-10-CM

## 2018-10-06 LAB — BASIC METABOLIC PANEL
Anion gap: 3 — ABNORMAL LOW (ref 5–15)
BUN: 10 mg/dL (ref 6–20)
CO2: 23 mmol/L (ref 22–32)
Calcium: 8.1 mg/dL — ABNORMAL LOW (ref 8.9–10.3)
Chloride: 109 mmol/L (ref 98–111)
Creatinine, Ser: 0.63 mg/dL (ref 0.44–1.00)
GFR calc Af Amer: >60 mL/min (ref >60)
GFR calc non Af Amer: >60 mL/min (ref >60)
Glucose, Bld: 84 mg/dL (ref 70–99)
Potassium: 4.3 mmol/L (ref 3.5–5.1)
Sodium: 135 mmol/L (ref 135–145)

## 2018-10-06 LAB — TYPE AND SCREEN
ABO/RH(D): O POS
ANTIBODY SCREEN: NEGATIVE
UNIT DIVISION: 0
Unit division: 0
Unit division: 0
Unit division: 0

## 2018-10-06 LAB — BPAM RBC
BLOOD PRODUCT EXPIRATION DATE: 202003062359
BLOOD PRODUCT EXPIRATION DATE: 202003082359
Blood Product Expiration Date: 202003082359
Blood Product Expiration Date: 202003082359
ISSUE DATE / TIME: 202002060725
ISSUE DATE / TIME: 202002060915
ISSUE DATE / TIME: 202002061018
ISSUE DATE / TIME: 202002061018
Unit Type and Rh: 5100
Unit Type and Rh: 5100
Unit Type and Rh: 5100
Unit Type and Rh: 5100

## 2018-10-06 LAB — CBC
HCT: 26.1 % — ABNORMAL LOW (ref 36.0–46.0)
Hemoglobin: 8.2 g/dL — ABNORMAL LOW (ref 12.0–15.0)
MCH: 24.6 pg — AB (ref 26.0–34.0)
MCHC: 31.4 g/dL (ref 30.0–36.0)
MCV: 78.4 fL — AB (ref 80.0–100.0)
Platelets: 581 10*3/uL — ABNORMAL HIGH (ref 150–400)
RBC: 3.33 MIL/uL — ABNORMAL LOW (ref 3.87–5.11)
RDW: 24.7 % — ABNORMAL HIGH (ref 11.5–15.5)
WBC: 5.7 10*3/uL (ref 4.0–10.5)
nRBC: 1.3 % — ABNORMAL HIGH (ref 0.0–0.2)

## 2018-10-06 MED ORDER — DOCUSATE SODIUM 100 MG PO CAPS: 100.0000 mg | ORAL_CAPSULE | Freq: Two times a day (BID) | ORAL | 0 refills | Status: DC

## 2018-10-06 MED ORDER — MEDROXYPROGESTERONE ACETATE 10 MG PO TABS: 10.0000 mg | ORAL_TABLET | Freq: Every day | ORAL | Status: DC

## 2018-10-06 MED ORDER — FERROUS GLUCONATE 324 (38 FE) MG PO TABS: 324.0000 mg | ORAL_TABLET | Freq: Two times a day (BID) | ORAL | 0 refills | Status: DC

## 2018-10-06 MED ORDER — MEDROXYPROGESTERONE ACETATE 10 MG PO TABS: ORAL_TABLET | ORAL | 0 refills | Status: DC

## 2018-10-06 NOTE — Discharge Summary (Signed)
Discharge Summary   Admit Date: 10/05/2018 Discharge Date: 10/06/2018 Discharging Service: Gynecology  Primary OBGYN: None Admitting Physician: Elonda Husky Discharge Physician: Ilda Basset  Referring Provider: [facility]  Primary Care Provider: Patient, No Pcp Per  Admission Diagnoses: *Chronic anemia *Fibroid uterus  Discharge Diagnoses: *Same *?hypertension  Consult Orders: None   Surgeries/Procedures Performed: Pelvic ultrasound  History and Physical: Obstetrics & Gynecology H&P   Date of Admission: 10/05/2018   Requesting Provider: [facility]  Primary OBGYN: [facility] Primary Care Provider: Patient, No Pcp Per  Reason for Admission: chronic anemia  History of Present Illness: Jodi Mendez is a 43 y.o. G1P0010 (Patient's last menstrual period was 09/30/2018 (exact date).), with the above CC. PMHx is significant for ?HTN, fibroids, anemia with VB and need for transfusion.   Seen in 2017 by Dr. Kennon Rounds after VB hospitalization. Pap, embx, tsh were negative and pt desired to preserve fertility.  Patient came to ED this morning and she states that she just felt awful and didn't feel right. She was only having vaginal spotting and exam there just had scant blood in the vault and no active bleeding. Pt found to be severely anemic and d/w overnight GYN and they accepted the transfer.   Patient having only spotting and no more n/v and never any pain, fevers, chills.   ROS: A 12-point review of systems was performed and negative, except as stated in the above HPI.  OBGYN History: As per HPI.                 OB History  Gravida Para Term Preterm AB Living  1 0     1    SAB TAB Ectopic Multiple Live Births         1           # Outcome Date GA Lbr Len/2nd Weight Sex Delivery Anes PTL Lv  1 Ectopic             Periods: qmonth, regular, 7-10d, very heavy for three days, no real pain. Started to be heavy in her late 12s She is currently using nothing for contraception.     Past Medical History:     Past Medical History:  Diagnosis Date  . Anemia     Past Surgical History:      Past Surgical History:  Procedure Laterality Date  . UNILATERAL SALPINGECTOMY Left 2007   Ectopic pregnancy. suprapubic mini-lap    Family History:       Family History  Problem Relation Age of Onset  . Diabetes Mother   . Hypertension Mother     Social History:  Social History        Socioeconomic History  . Marital status: Divorced    Spouse name: Not on file  . Number of children: 0  . Years of education: Not on file  . Highest education level: Some college, no degree  Occupational History  . Not on file  Social Needs  . Financial resource strain: Not on file  . Food insecurity:    Worry: Not on file    Inability: Not on file  . Transportation needs:    Medical: Yes    Non-medical: Yes  Tobacco Use  . Smoking status: Never Smoker  . Smokeless tobacco: Never Used  Substance and Sexual Activity  . Alcohol use: Yes    Comment: social  . Drug use: No  . Sexual activity: Not Currently    Comment: not within the last couple of  months/sexual active with women  Lifestyle  . Physical activity:    Days per week: Not on file    Minutes per session: Not on file  . Stress: Not on file  Relationships  . Social connections:    Talks on phone: Not on file    Gets together: Not on file    Attends religious service: Not on file    Active member of club or organization: Not on file    Attends meetings of clubs or organizations: Not on file    Relationship status: Not on file  . Intimate partner violence:    Fear of current or ex partner: Not on file    Emotionally abused: Not on file    Physically abused: Not on file    Forced sexual activity: Not on file  Other Topics Concern  . Not on file  Social History Narrative  . Not on file    Allergy:      Allergies  Allergen Reactions  . Sulfa  Antibiotics Hives and Itching    Itching/hives    Current Outpatient Medications:        Medications Prior to Admission  Medication Sig Dispense Refill Last Dose  . Multiple Vitamin (MULTIVITAMIN WITH MINERALS) TABS Take 1 tablet by mouth daily.    10/04/2018 at Unknown time  . doxycycline (VIBRA-TABS) 100 MG tablet Take 1 tablet (100 mg total) by mouth 2 (two) times daily. (Patient not taking: Reported on 09/19/2018) 14 tablet 0 Completed Course at Unknown time  . megestrol (MEGACE) 40 MG tablet Take 1 tablet (40 mg total) by mouth 2 (two) times daily. (Patient not taking: Reported on 09/19/2018) 60 tablet 3 Not Taking at Unknown time     Hospital Medications:          Current Facility-Administered Medications  Medication Dose Route Frequency Provider Last Rate Last Dose  . 0.9 %  sodium chloride infusion (Manually program via Guardrails IV Fluids)   Intravenous Once Aletha Halim, MD      . acetaminophen (TYLENOL) tablet 650 mg  650 mg Oral Q6H PRN Aletha Halim, MD      . norethindrone (AYGESTIN) tablet 5 mg  5 mg Oral BID Aletha Halim, MD      . ondansetron [facility]) tablet 4 mg  4 mg Oral Q6H PRN Aletha Halim, MD       Or  . ondansetron (ZOFRAN) injection 4 mg  4 mg Intravenous Q6H PRN Aletha Halim, MD      . tranexamic acid (LYSTEDA) tablet 1,300 mg  1,300 mg Oral TID Aletha Halim, MD         Physical Exam:  Current Vital Signs 24h Vital Sign Ranges  T 98.9 F (37.2 C) Temp  Avg: 98.1 F (36.7 C)  Min: 97.6 F (36.4 C)  Max: 98.9 F (37.2 C)  BP 140/81 BP  Min: 105/59  Max: 144/81  HR 76 Pulse  Avg: 80  Min: 74  Max: 88  RR 20 Resp  Avg: 18  Min: 10  Max: 23  SaO2 100 % Room Air SpO2  Avg: 100 %  Min: 100 %  Max: 100 %       24 Hour I/O Current Shift I/O  Time Ins Outs No intake/output data recorded. 02/06 0701 - 02/06 1900 In: 1000  Out: -    Patient Vitals for the past 24 hrs:  BP Temp Temp src Pulse Resp SpO2 Height Weight   10/05/18 1101 140/81 98.9 F (  37.2 C) Oral 76 20 100 % - -  10/05/18 0957 139/76 98 F (36.7 C) Oral 79 (!) 21 100 % - -  10/05/18 0945 129/74 - - 78 16 100 % - -  10/05/18 0934 128/68 98.2 F (36.8 C) Oral 77 10 100 % - -  10/05/18 0900 136/73 - - 81 - 100 % - -  10/05/18 0845 118/63 - - 79 - 100 % - -  10/05/18 0830 122/67 - - 80 - 100 % - -  10/05/18 0815 109/64 - - 83 - 100 % - -  10/05/18 0800 124/68 - - 88 - 100 % - -  10/05/18 0751 139/75 98 F (36.7 C) Oral 84 19 100 % - -  10/05/18 0745 129/75 - - 86 - 100 % - -  10/05/18 0737 (!) 144/81 98.1 F (36.7 C) Oral 85 19 100 % - -  10/05/18 0700 132/71 - - - 20 - - -  10/05/18 0630 128/70 - - 82 (!) 23 100 % - -  10/05/18 0615 129/69 - - 77 16 100 % - -  10/05/18 0600 - - - 78 - 100 % - -  10/05/18 0545 (!) 105/59 - - 80 - 100 % - -  10/05/18 0530 113/62 - - 74 - 100 % - -  10/05/18 0515 119/68 - - 76 - 100 % - -  10/05/18 0500 117/62 - - 75 - 100 % - -  10/05/18 0445 120/60 - - 81 - 100 % - -  10/05/18 0430 123/68 - - 80 - 100 % - -  10/05/18 0415 122/79 - - 80 - 100 % - -  10/05/18 0413 - - - - - - 5\' 3"  (1.6 m) 79.4 kg  10/05/18 0412 125/68 97.6 F (36.4 C) Oral 80 16 100 % - -    Body mass index is 31 kg/m. General appearance: Well nourished, well developed female in no acute distress.  Cardiovascular: S1, S2 normal, no murmur, rub or gallop, regular rate and rhythm Respiratory:  Clear to auscultation bilateral. Normal respiratory effort Abdomen: positive bowel sounds and no masses, hernias; diffusely non tender to palpation, non distended. Well healed suprapubic transverse skin incision Neuro/Psych:  Normal mood and affect.  Skin:  Warm and dry.  Extremities: no clubbing, cyanosis, or edema.   Pelvic exam: deferred Laboratory: Negative: PT/INR/PTT LastLabs     Recent Labs  Lab 10/05/18 0608  WBC 9.2  HGB 2.7*  HCT 10.8*  PLT 561*     LastLabs  Recent Labs  Lab 10/05/18 0444  NA 137  K  3.6  CL 104  CO2 21*  BUN 9  CREATININE 1.10*  CALCIUM 8.8*  GLUCOSE 132*     LastLabs     Recent Labs  Lab 10/05/18 0608  APTT 31  INR 1.11     LastLabs     Recent Labs  Lab 10/05/18 0442  ABORH O POS      Imaging:  none  Assessment: Ms. Tony is a 43 y.o. G1P0010 (Patient's last menstrual period was 09/30/2018 (exact date).) with severe anemia. Pt stable Plan: Continue with plan for 4U PRBCs. T&s updated. U/s ordered. D/w her re: options after hospitalization and will talk to her more after u/s is back. Start bid aygestin; pt received a dose of megace in the ED. SCDs, OOB. Regular diet. Repeat labs in AM. Suspect aki due to anemia.   Dispo: likely tomorrow.    Eduard Clos  Cassell Smiles MD Attending Center for [facility])         Electronically signed by Aletha Halim, MD at 10/05/2018 2:02 PM    Hospital Course: *Anemia: patient received 4U PRBC *Vaginal spotting: pt discharged to home on provera in order to keep her from bleeding in the future *Fibroids: options d/w pt. Looks like she does have a submucosal fibroid. Hysteroscopy and possible myomectomy vs hysterectomy d/w pt. Pt unsure if wants to preserve fertility. Operative risks d/w her. Pt to consider options and follow up in clinic to finalize plan.  *?HTN: TSH normal last year. continue to follow. Patient doesn't have insurance or a PCP. Information given for [facility].  *LO cyst: repeat u/s in 6-12wks needed. D/w pt  CLINICAL DATA:  Uterine fibroids; history of prior LEFT salpingectomy for ectopic pregnancy  EXAM: TRANSABDOMINAL AND TRANSVAGINAL ULTRASOUND OF PELVIS  TECHNIQUE: Both transabdominal and transvaginal ultrasound examinations of the pelvis were performed. Transabdominal technique was performed for global imaging of the pelvis including uterus, ovaries, adnexal regions, and pelvic cul-de-sac. It was necessary to proceed  with endovaginal exam following the transabdominal exam to visualize the endometrium and RIGHT ovary.  COMPARISON:  02/09/2013  FINDINGS: Uterus  Measurements: 13.6 x 6.8 x 8.4 cm = volume: 405 mL. Numerous soft tissue nodule seen within the uterus compatible with leiomyomata. Largest of these measure 3.9 x 4.4 x 3.9 cm centrally within the upper uterus extending into the endometrial complex; 3.5 x 3.5 x 3.0 cm at fundus, exophytic; and 5.3 x 3.5 x 3.6 cm posteriorly at upper uterus, also likely extending submucosal.  Endometrium  Thickness: 11 mm at mid uterus; completely obscured at upper uterine segment where a large leiomyoma is identified. No endometrial free fluid  Right ovary  Not visualized on either transabdominal or endovaginal imaging, likely obscured by bowel  Left ovary  Measurements: 5.1 x 3.6 x 2.7 cm = volume: 27.6 mL. Mildly cyst of the LEFT ovary 3.6 x 3.1 x 2.2 cm in size question old hemorrhagic cyst containing septations and internal echogenicity; no blood flow within this lesion on color Doppler imaging.  Other findings  No free pelvic fluid.  No other adnexal masses.  IMPRESSION: Enlarged uterus containing numerous uterine leiomyomata, including a 4.4 cm diameter mass which extends into the endometrial complex at the upper uterine segment.  Mildly complicated cyst of the LEFT ovary 3.6 x 3.1 x 2.2 cm in size, question hemorrhagic cyst; Short-interval follow up ultrasound in 6-12 weeks is recommended, preferably during the week following the patient's normal menses.   Electronically Signed   By: Lavonia Dana M.D.   On: 10/05/2018 16:58  CBC Latest Ref Rng & Units 10/06/2018 10/05/2018 05/24/2016  WBC 4.0 - 10.5 K/uL 5.7 9.2 5.0  Hemoglobin 12.0 - 15.0 g/dL 8.2(L) 2.7(LL) 8.4(L)  Hematocrit 36.0 - 46.0 % 26.1(L) 10.8(L) 28.2(L)  Platelets 150 - 400 K/uL 581(H) 561(H) 364    Discharge Exam:  Current Vital Signs 24h Vital Sign  Ranges  T 98 F (36.7 C) Temp  Avg: 98.4 F (36.9 C)  Min: 98 F (36.7 C)  Max: 98.9 F (37.2 C)  BP (!) 152/92 BP  Min: 120/65  Max: 152/92  HR 65 Pulse  Avg: 75  Min: 65  Max: 91  RR 18 Resp  Avg: 16.8  Min: 10  Max: 21  SaO2 100 % Room Air SpO2  Avg: 100 %  Min: 100 %  Max: 100 %  24 Hour I/O Current Shift I/O  Time Ins Outs 02/06 0701 - 02/07 0700 In: 2119.9  Out: -  No intake/output data recorded.   General appearance: Well nourished, well developed female in no acute distress.  Cardiovascular: S1, S2 normal, no murmur, rub or gallop, regular rate and rhythm Respiratory:  Clear to auscultation bilateral. Normal respiratory effort Abdomen: positive bowel sounds and no masses, hernias; diffusely non tender to palpation, non distended Neuro/Psych:  Normal mood and affect.  Skin:  Warm and dry.    Discharge Disposition:  Home  Patient Instructions:  Standard   Results Pending at Discharge:  none  Discharge Medications: Allergies as of 10/06/2018      Reactions   Sulfa Antibiotics Hives, Itching   Itching/hives      Medication List    STOP taking these medications   doxycycline 100 MG tablet Commonly known as:  VIBRA-TABS   megestrol 40 MG tablet Commonly known as:  MEGACE     TAKE these medications   docusate sodium 100 MG capsule Commonly known as:  COLACE Take 1 capsule (100 mg total) by mouth 2 (two) times daily.   ferrous gluconate 324 MG tablet Commonly known as:  FERGON Take 1 tablet (324 mg total) by mouth 2 (two) times daily with a meal.   medroxyPROGESTERone 10 MG tablet Commonly known as:  PROVERA 10mg  po qday. Increase to 10mg  po bid with heavy bleeding   multivitamin with minerals Tabs tablet Take 1 tablet by mouth daily.       Follow Up 10/13/2018 @ Murphy, Jr. MD Attending Center for [facility])

## 2018-10-06 NOTE — Progress Notes (Signed)
RN gave patient financial help sheet from the clinic.  Her appointments are set up to see Dr Ilda Basset.  We discussed medications and she is ready for discharge.  RN walked patient to the car.

## 2018-10-13 ENCOUNTER — Encounter: Payer: Self-pay | Admitting: Obstetrics and Gynecology

## 2018-10-13 ENCOUNTER — Ambulatory Visit (INDEPENDENT_AMBULATORY_CARE_PROVIDER_SITE_OTHER): Payer: Self-pay | Admitting: Obstetrics and Gynecology

## 2018-10-13 VITALS — BP 162/102 | HR 53 | Wt 181.9 lb

## 2018-10-13 DIAGNOSIS — N83292 Other ovarian cyst, left side: Secondary | ICD-10-CM

## 2018-10-13 NOTE — Progress Notes (Signed)
Obstetrics and Gynecology Visit Return Patient Evaluation  Appointment Date: 10/13/2018  Primary Care Provider: Patient, No Pcp Per  OBGYN Clinic: Center for [facility]  Chief Complaint: f/u hospitalization  History of Present Illness:  Jodi DROMGOOLE is a 43 y.o. with above CC. PMHx significant for HTN, fibroids, anemia.   No bleeding, pain, ha, chest pain.   Review of Systems:  as noted in the History of Present Illness.   Patient Active Problem List   Diagnosis Date Noted  . Complex cyst of left ovary 10/06/2018  . Chronic anemia 10/05/2018  . Menorrhagia 02/09/2013  . Leiomyoma of uterus 02/09/2013   Medications:  [name] had no medications administered during this visit. Current Outpatient Medications  Medication Sig Dispense Refill  . docusate sodium (COLACE) 100 MG capsule Take 1 capsule (100 mg total) by mouth 2 (two) times daily. 90 capsule 0  . ferrous gluconate (FERGON) 324 MG tablet Take 1 tablet (324 mg total) by mouth 2 (two) times daily with a meal. 90 tablet 0  . medroxyPROGESTERone (PROVERA) 10 MG tablet 10mg  po qday. Increase to 10mg  po bid with heavy bleeding 60 tablet 0  . Multiple Vitamin (MULTIVITAMIN WITH MINERALS) TABS Take 1 tablet by mouth daily.      No current facility-administered medications for this visit.     Allergies: is allergic to sulfa antibiotics.  Physical Exam:  BP (!) 162/102   Pulse (!) 53   Wt 181 lb 14.4 oz (82.5 kg)   LMP 09/30/2018 (Exact Date)   BMI 32.22 kg/m  Body mass index is 32.22 kg/m. General appearance: Well nourished, well developed female in no acute distress.  Neuro/Psych:  Normal mood and affect.     Assessment: pt stable  Plan: Patient in the midst of applying for [facility] assistance. I also told her to try applying for medicaid too. Continue daily provera and told to increase dose if bleeding comes back. Options again d/w her and she wants to pursue hysteroscopy and  myomectomy. Continue bid iron.   Repeat u/s in 6wks and post case for after that in case laparoscopy needed.   RTC: PRN  Durene Romans MD Attending Center for [facility])

## 2018-10-16 ENCOUNTER — Telehealth: Payer: Self-pay

## 2018-10-16 ENCOUNTER — Encounter (HOSPITAL_COMMUNITY): Payer: Self-pay

## 2018-10-16 NOTE — Telephone Encounter (Signed)
-----   Message from Aletha Halim, MD sent at 10/13/2018  9:47 AM EST ----- Regarding: needs transvag u/s in 6wks. order is in. thanks

## 2018-10-16 NOTE — Telephone Encounter (Signed)
Called pt to advise of Scheduled [place], Pt verbalized understanding.

## 2018-10-19 ENCOUNTER — Ambulatory Visit (HOSPITAL_COMMUNITY)
Admission: RE | Admit: 2018-10-19 | Discharge: 2018-10-19 | Disposition: A | Discharge status: Home / Self Care | Payer: Self-pay | Source: Ambulatory Visit | Attending: Obstetrics and Gynecology | Admitting: Obstetrics and Gynecology

## 2018-10-19 DIAGNOSIS — N83292 Other ovarian cyst, left side: Secondary | ICD-10-CM | POA: Insufficient documentation

## 2018-10-24 ENCOUNTER — Other Ambulatory Visit: Payer: Self-pay

## 2018-10-24 ENCOUNTER — Telehealth: Payer: Self-pay

## 2018-10-24 MED ORDER — MEGESTROL ACETATE 40 MG PO TABS: 80.0000 mg | ORAL_TABLET | Freq: Two times a day (BID) | ORAL | 0 refills | Status: DC

## 2018-10-24 NOTE — Telephone Encounter (Signed)
Called patient concerning her bleeding. Patient reports soaking up to 5 depends per day. She reports being put on provera 10 mg but it is not helping. Per Eben Burow patient will need to stop the provera 10 mg and start megace 80 bid for 3 weeks. She will need to scheduled a follow up in one week to be seen.

## 2018-10-24 NOTE — Telephone Encounter (Signed)
Pt stated that she was put on medication for bleeding however she has been bleeding for a week now should she still use the medication.

## 2018-10-26 ENCOUNTER — Telehealth: Payer: Self-pay | Admitting: Obstetrics and Gynecology

## 2018-10-26 NOTE — Telephone Encounter (Signed)
Called patient about her appointment. Was asked by Demetrice Phillip Heal CMA, to schedule patient sooner due to heavy bleeding. Patient stated she had not picked up her medicine yet, and the bleeding was not as bad. She has requested for a call back from the nurse. She has questions.

## 2018-10-30 NOTE — Telephone Encounter (Signed)
I called Juliza back and left a message I am returning her call. Please call us back if you still need to discuss an issue with [place].

## 2018-10-31 ENCOUNTER — Telehealth: Payer: Self-pay | Admitting: Obstetrics and Gynecology

## 2018-10-31 NOTE — Telephone Encounter (Signed)
GYN Telephone Note Patient called [phone] and generic VM answered. VM left asking patient how applying for [facility] Assistance is going. Will need to try and get her set up with primary care prior to surgery  Durene Romans MD Attending Center for [place] [facility]) 10/31/2018 Time: [phone]

## 2018-11-06 ENCOUNTER — Encounter: Payer: Self-pay | Admitting: Obstetrics and Gynecology

## 2018-11-06 ENCOUNTER — Ambulatory Visit (INDEPENDENT_AMBULATORY_CARE_PROVIDER_SITE_OTHER): Payer: Self-pay | Admitting: Obstetrics and Gynecology

## 2018-11-06 VITALS — BP 162/90 | HR 56 | Ht 63.0 in | Wt 178.4 lb

## 2018-11-06 DIAGNOSIS — D649 Anemia, unspecified: Secondary | ICD-10-CM

## 2018-11-06 DIAGNOSIS — I10 Essential (primary) hypertension: Secondary | ICD-10-CM

## 2018-11-06 HISTORY — DX: Essential (primary) hypertension: I10

## 2018-11-06 LAB — CBC
Hematocrit: 28.9 % — ABNORMAL LOW (ref 34.0–46.6)
Hemoglobin: 8.9 g/dL — ABNORMAL LOW (ref 11.1–15.9)
MCH: 24.8 pg — ABNORMAL LOW (ref 26.6–33.0)
MCHC: 30.8 g/dL — ABNORMAL LOW (ref 31.5–35.7)
MCV: 81 fL (ref 79–97)
PLATELETS: 423 10*3/uL (ref 150–450)
RBC: 3.59 x10E6/uL — ABNORMAL LOW (ref 3.77–5.28)
RDW: 22 % — ABNORMAL HIGH (ref 11.7–15.4)
WBC: 4.6 10*3/uL (ref 3.4–10.8)

## 2018-11-06 NOTE — Progress Notes (Signed)
APPT. Made for pt at [place] on 11/29/2018 at 2:50p.

## 2018-11-06 NOTE — Progress Notes (Signed)
Obstetrics and Gynecology Visit Return Patient Evaluation  Appointment Date: 11/06/2018  Primary Care Provider: Patient, No Pcp Per  OBGYN Clinic: Center for [facility]  Chief Complaint: follow up bleeding  History of Present Illness:  Jodi Mendez is a 43 y.o. with above CC. Patient had increased bleeding and was switched from provera to megace and she hasn't had any bleeding for the past two weeks. She's currently on megace 80 bid. She stopped the iron when she switched to megace about 2-3wks ago.   She also had an u/s done on 2/20 that showed a smaller 1-2cm LO ?hemorrhagic cyst from 3-4cm about two weeks prior  Patient currently denies any s/s of anemia. She submitted some paperwork for the [facility] Assistance program but hasn't heard back  Review of Systems: as noted in the History of Present Illness.   Patient Active Problem List   Diagnosis Date Noted  . Complex cyst of left ovary 10/06/2018  . Chronic anemia 10/05/2018  . Menorrhagia 02/09/2013  . Leiomyoma of uterus 02/09/2013   Medications:  [name] had no medications administered during this visit. Current Outpatient Medications  Medication Sig Dispense Refill  . docusate sodium (COLACE) 100 MG capsule Take 1 capsule (100 mg total) by mouth 2 (two) times daily. 90 capsule 0  . megestrol (MEGACE) 40 MG tablet Take 2 tablets (80 mg total) by mouth 2 (two) times daily. 90 tablet 0  . Multiple Vitamin (MULTIVITAMIN WITH MINERALS) TABS Take 1 tablet by mouth daily.     . ferrous gluconate (FERGON) 324 MG tablet Take 1 tablet (324 mg total) by mouth 2 (two) times daily with a meal. (Patient not taking: Reported on 11/06/2018) 90 tablet 0  . medroxyPROGESTERone (PROVERA) 10 MG tablet 10mg  po qday. Increase to 10mg  po bid with heavy bleeding (Patient not taking: Reported on 11/06/2018) 60 tablet 0   No current facility-administered medications for this visit.     Allergies: is allergic to sulfa  antibiotics.  Physical Exam:  BP (!) 162/90 (BP Location: Left Arm)   Pulse (!) 56   Ht 5\' 3"  (1.6 m)   Wt 178 lb 6.4 oz (80.9 kg)   BMI 31.60 kg/m  Body mass index is 31.6 kg/m. General appearance: Well nourished, well developed female in no acute distress.  Neuro/Psych:  Normal mood and affect.     Assessment: pt stable  Plan:  1. Chronic anemia Will recheck cbc but pt told to go ahead and start the po iron bid with vitamin c for now - CBC  2. Hypertension, unspecified type Patient set up for 4/1 PCP visit; pt had prolonged qtc in early feb hospital admission - Ambulatory referral to Internal Medicine  3. Fibroids Patient set up for 4/15 hysteroscopy, d&c and vaginal myomectomy. Pt told to call [facility] to make sure they can get her approved for the surgery   RTC: post op  Durene Romans MD Attending Center for [facility])

## 2018-11-21 ENCOUNTER — Telehealth: Payer: Self-pay

## 2018-11-21 MED ORDER — MEGESTROL ACETATE 40 MG PO TABS: 80.0000 mg | ORAL_TABLET | Freq: Two times a day (BID) | ORAL | 0 refills | Status: DC

## 2018-11-21 NOTE — Telephone Encounter (Signed)
Error

## 2018-11-21 NOTE — Telephone Encounter (Signed)
Received message stating that pt requested medication refill and had questions on procedure.  Called pt and pt asked if she can get have one refill on her Megace.  Per Jorje Guild, NP pt can have a refill on her Megace due to Dr. Ilda Basset being out of the office and her surgery being postponed due to COVID-19.  Inbasket message sent to Dr.Pickens for future refills on Megace to hold pt until surgery date.

## 2018-11-29 ENCOUNTER — Ambulatory Visit: Payer: Self-pay | Admitting: Family Medicine

## 2018-12-06 ENCOUNTER — Telehealth: Payer: Self-pay | Admitting: Family Medicine

## 2018-12-06 NOTE — Telephone Encounter (Signed)
Please contact patient and schedule for face to face visit on 4/15. I received a note from her gynecologist that referred her to this office, advising that she is needing medical evaluation and clearance for upcoming surgery in May.

## 2018-12-06 NOTE — Telephone Encounter (Signed)
Called patient, lvm to call back to schedule an appoint. Will keep trying to reach patient.

## 2018-12-12 NOTE — Telephone Encounter (Signed)
Patient has appointment scheduled for 12/18/2018.

## 2018-12-13 ENCOUNTER — Telehealth: Payer: Self-pay | Admitting: Family Medicine

## 2018-12-13 NOTE — Telephone Encounter (Signed)
Jodi Mendez,  This patient is scheduled for a phone visit 12/18/18 and will need a face to face encounter as this is new hypertension diagnosis and will needs labs to medically clear patient for upcoming surgery.   Molli Barrows, FNP

## 2018-12-14 ENCOUNTER — Telehealth: Payer: Self-pay

## 2018-12-14 NOTE — Telephone Encounter (Signed)
Called patient to do their pre-visit COVID screening.  Called went to voicemail. Unable to do screening.

## 2018-12-15 NOTE — Telephone Encounter (Signed)
Patient has face to face appointment scheduled for 04/20.

## 2018-12-18 ENCOUNTER — Ambulatory Visit: Payer: Self-pay | Admitting: Family Medicine

## 2018-12-27 ENCOUNTER — Telehealth (INDEPENDENT_AMBULATORY_CARE_PROVIDER_SITE_OTHER): Payer: Self-pay

## 2018-12-27 ENCOUNTER — Other Ambulatory Visit: Payer: Self-pay | Admitting: Student

## 2018-12-27 DIAGNOSIS — N921 Excessive and frequent menstruation with irregular cycle: Secondary | ICD-10-CM

## 2018-12-27 MED ORDER — MEGESTROL ACETATE 40 MG PO TABS: 80.0000 mg | ORAL_TABLET | Freq: Two times a day (BID) | ORAL | 0 refills | Status: DC

## 2018-12-27 NOTE — Telephone Encounter (Signed)
Pt requesting a refill on her Megace.  Dr. Ilda Basset refilled pt's Megace and strongly urged pt to keep her appt scheduled with her PCP.  I informed pt that her medication was refilled and that she needed to make sure that goes to her primary care appt scheduled on 01/17/19.  Pt asks about her paperwork for financial assistance.  I advised the pt to make sure that she f/u with that because she will also need either that or Medicaid in order to have the surgery.  Pt verbalized understanding with no further questions.

## 2019-01-04 ENCOUNTER — Telehealth: Payer: Self-pay | Admitting: *Deleted

## 2019-01-04 DIAGNOSIS — D649 Anemia, unspecified: Secondary | ICD-10-CM

## 2019-01-04 NOTE — Telephone Encounter (Signed)
LM that we calling in regards to her medication concern if she could please give the office a call.

## 2019-01-04 NOTE — Telephone Encounter (Signed)
Pt called our office with questions about her Iron Rx.  Please contact pt to discuss.

## 2019-01-05 ENCOUNTER — Encounter: Payer: Self-pay | Admitting: Obstetrics and Gynecology

## 2019-01-08 MED ORDER — FERROUS GLUCONATE 324 (38 FE) MG PO TABS: 324.0000 mg | ORAL_TABLET | Freq: Two times a day (BID) | ORAL | 1 refills | Status: DC

## 2019-01-08 NOTE — Telephone Encounter (Signed)
Called pt to advised her that the refill on her iron was called in to the [facility] on [address].  Pt did not pick up.  Left message advising pt that her prescription was called in and reminding her she has a PCP appointment on 01/17/19.

## 2019-01-08 NOTE — Addendum Note (Signed)
Addended by: Dolores Hoose on: 01/08/2019 11:47 AM   Modules accepted: Orders

## 2019-01-08 NOTE — Telephone Encounter (Signed)
Returned pt's call regarding her concerns about her iron prescription.  Pt states that she did not take it because it was 324mg  and not 325mg .  Advised pt that the 324mg  was ferrous gluconate and was different than the ferrous sulfate 325mg .  Pt verbalized understanding and stated she needs a refill on the medication.  Request sent to provider.

## 2019-01-08 NOTE — Telephone Encounter (Signed)
Yes, go ahead and please remind her of her 5/20 appt with her PCP and that she needs to keep this appt if she wants [place] to keep her on the schedule for her June surgery. thanks

## 2019-01-16 ENCOUNTER — Telehealth: Payer: Self-pay

## 2019-01-16 NOTE — Telephone Encounter (Signed)
Called patient to do their pre-visit COVID screening.  Have you recently traveled internationally([place], [place], [place], [place], [place]) or within the [place] to a hotspot area([place], [place], [place], [place], [place])? no  Are you currently experiencing any of the following: fever, cough, SHOB, fatigue? no  Have you been in contact with anyone who has recently travelled? no  Have you been in contact with anyone who is experiencing fever, cough, SHOB, fatigue or been diagnosed with COVID  or works in or has recently visited a SNF? no  

## 2019-01-17 ENCOUNTER — Encounter: Payer: Self-pay | Admitting: Family Medicine

## 2019-01-17 ENCOUNTER — Other Ambulatory Visit: Payer: Self-pay

## 2019-01-17 ENCOUNTER — Telehealth: Payer: Self-pay | Admitting: Obstetrics & Gynecology

## 2019-01-17 ENCOUNTER — Ambulatory Visit (INDEPENDENT_AMBULATORY_CARE_PROVIDER_SITE_OTHER): Payer: Self-pay | Admitting: Family Medicine

## 2019-01-17 VITALS — BP 157/103 | HR 56 | Temp 98.3°F | Resp 17 | Ht 63.0 in | Wt 189.8 lb

## 2019-01-17 DIAGNOSIS — Z131 Encounter for screening for diabetes mellitus: Secondary | ICD-10-CM

## 2019-01-17 DIAGNOSIS — Z7689 Persons encountering health services in other specified circumstances: Secondary | ICD-10-CM

## 2019-01-17 DIAGNOSIS — R9431 Abnormal electrocardiogram [ECG] [EKG]: Secondary | ICD-10-CM

## 2019-01-17 DIAGNOSIS — Z1329 Encounter for screening for other suspected endocrine disorder: Secondary | ICD-10-CM

## 2019-01-17 DIAGNOSIS — Z3202 Encounter for pregnancy test, result negative: Secondary | ICD-10-CM

## 2019-01-17 DIAGNOSIS — D649 Anemia, unspecified: Secondary | ICD-10-CM

## 2019-01-17 DIAGNOSIS — Z13220 Encounter for screening for lipoid disorders: Secondary | ICD-10-CM

## 2019-01-17 DIAGNOSIS — I10 Essential (primary) hypertension: Secondary | ICD-10-CM

## 2019-01-17 DIAGNOSIS — Z1389 Encounter for screening for other disorder: Secondary | ICD-10-CM

## 2019-01-17 LAB — POCT URINALYSIS DIP (CLINITEK)
Bilirubin, UA: NEGATIVE
Glucose, UA: NEGATIVE mg/dL
Ketones, POC UA: NEGATIVE mg/dL
Nitrite, UA: NEGATIVE
POC PROTEIN,UA: NEGATIVE
Spec Grav, UA: 1.02 (ref 1.010–1.025)
Urobilinogen, UA: 0.2 E.U./dL
pH, UA: 7.5 (ref 5.0–8.0)

## 2019-01-17 LAB — POCT URINE PREGNANCY: Preg Test, Ur: NEGATIVE

## 2019-01-17 MED ORDER — AMLODIPINE BESYLATE 5 MG PO TABS: 5.0000 mg | ORAL_TABLET | Freq: Every day | ORAL | 2 refills | Status: DC

## 2019-01-17 NOTE — Telephone Encounter (Signed)
Called the patient to inform of upcoming appointment. Left a detailed voicemail regarding the mychart visit.

## 2019-01-17 NOTE — Patient Instructions (Addendum)
Thank you for choosing Primary Care at [facility] to be your medical home!    Jodi Mendez was seen by Molli Barrows, FNP today.   Jodi Mendez's primary care provider is Scot Jun, FNP.   For the best care possible, you should try to see Molli Barrows, FNP-C whenever you come to the clinic.   We look forward to seeing you again soon!  If you have any questions about your visit today, please call us at [phone] or feel free to reach your primary care provider via [place].        DASH Eating Plan DASH stands for "Dietary Approaches to Stop Hypertension." The DASH eating plan is a healthy eating plan that has been shown to reduce high blood pressure (hypertension). It may also reduce your risk for type 2 diabetes, heart disease, and stroke. The DASH eating plan may also help with weight loss. What are tips for following this plan?  General guidelines  Avoid eating more than 2,300 mg (milligrams) of salt (sodium) a day. If you have hypertension, you may need to reduce your sodium intake to 1,500 mg a day.  Limit alcohol intake to no more than 1 drink a day for nonpregnant women and 2 drinks a day for men. One drink equals 12 oz of beer, 5 oz of wine, or 1 oz of hard liquor.  Work with your health care provider to maintain a healthy body weight or to lose weight. Ask what an ideal weight is for you.  Get at least 30 minutes of exercise that causes your heart to beat faster (aerobic exercise) most days of the week. Activities may include walking, swimming, or biking.  Work with your health care provider or diet and nutrition specialist (dietitian) to adjust your eating plan to your individual calorie needs. Reading food labels   Check food labels for the amount of sodium per serving. Choose foods with less than 5 percent of the Daily Value of sodium. Generally, foods with less than 300 mg of sodium per serving fit into this eating plan.  To find whole  grains, look for the word "whole" as the first word in the ingredient list. Shopping  Buy products labeled as "low-sodium" or "no salt added."  Buy fresh foods. Avoid canned foods and premade or frozen meals. Cooking  Avoid adding salt when cooking. Use salt-free seasonings or herbs instead of table salt or sea salt. Check with your health care provider or pharmacist before using salt substitutes.  Do not fry foods. Cook foods using healthy methods such as baking, boiling, grilling, and broiling instead.  Cook with heart-healthy oils, such as olive, canola, soybean, or sunflower oil. Meal planning  Eat a balanced diet that includes: ? 5 or more servings of fruits and vegetables each day. At each meal, try to fill half of your plate with fruits and vegetables. ? Up to 6-8 servings of whole grains each day. ? Less than 6 oz of lean meat, poultry, or fish each day. A 3-oz serving of meat is about the same size as a deck of cards. One egg equals 1 oz. ? 2 servings of low-fat dairy each day. ? A serving of nuts, seeds, or beans 5 times each week. ? Heart-healthy fats. Healthy fats called Omega-3 fatty acids are found in foods such as flaxseeds and coldwater fish, like sardines, salmon, and mackerel.  Limit how much you eat of the following: ? Canned or prepackaged foods. ? Food that  is high in trans fat, such as fried foods. ? Food that is high in saturated fat, such as fatty meat. ? Sweets, desserts, sugary drinks, and other foods with added sugar. ? Full-fat dairy products.  Do not salt foods before eating.  Try to eat at least 2 vegetarian meals each week.  Eat more home-cooked food and less restaurant, buffet, and fast food.  When eating at a restaurant, ask that your food be prepared with less salt or no salt, if possible. What foods are recommended? The items listed may not be a complete list. Talk with your dietitian about what dietary choices are best for  you. Grains Whole-grain or whole-wheat bread. Whole-grain or whole-wheat pasta. Brown rice. [place]ena Morrow. Bulgur. Whole-grain and low-sodium cereals. Pita bread. Low-fat, low-sodium crackers. Whole-wheat flour tortillas. Vegetables Fresh or frozen vegetables (raw, steamed, roasted, or grilled). Low-sodium or reduced-sodium tomato and vegetable juice. Low-sodium or reduced-sodium tomato sauce and tomato paste. Low-sodium or reduced-sodium canned vegetables. Fruits All fresh, dried, or frozen fruit. Canned fruit in natural juice (without added sugar). Meat and other protein foods Skinless chicken or [place]. Ground chicken or [place]. Pork with fat trimmed off. Fish and seafood. Egg whites. Dried beans, peas, or lentils. Unsalted nuts, nut butters, and seeds. Unsalted canned beans. Lean cuts of beef with fat trimmed off. Low-sodium, lean deli meat. Dairy Low-fat (1%) or fat-free (skim) milk. Fat-free, low-fat, or reduced-fat cheeses. Nonfat, low-sodium ricotta or cottage cheese. Low-fat or nonfat yogurt. Low-fat, low-sodium cheese. Fats and oils Soft margarine without trans fats. Vegetable oil. Low-fat, reduced-fat, or light mayonnaise and salad dressings (reduced-sodium). Canola, safflower, olive, soybean, and sunflower oils. Avocado. Seasoning and other foods Herbs. Spices. Seasoning mixes without salt. Unsalted popcorn and pretzels. Fat-free sweets. What foods are not recommended? The items listed may not be a complete list. Talk with your dietitian about what dietary choices are best for you. Grains Baked goods made with fat, such as croissants, muffins, or some breads. Dry pasta or rice meal packs. Vegetables Creamed or fried vegetables. Vegetables in a cheese sauce. Regular canned vegetables (not low-sodium or reduced-sodium). Regular canned tomato sauce and paste (not low-sodium or reduced-sodium). Regular tomato and vegetable juice (not low-sodium or reduced-sodium). Angie Fava.  Olives. Fruits Canned fruit in a light or heavy syrup. Fried fruit. Fruit in cream or butter sauce. Meat and other protein foods Fatty cuts of meat. Ribs. Fried meat. Berniece Salines. Sausage. Bologna and other processed lunch meats. Salami. Fatback. Hotdogs. Bratwurst. Salted nuts and seeds. Canned beans with added salt. Canned or smoked fish. Whole eggs or egg yolks. Chicken or [place] with skin. Dairy Whole or 2% milk, cream, and half-and-half. Whole or full-fat cream cheese. Whole-fat or sweetened yogurt. Full-fat cheese. Nondairy creamers. Whipped toppings. Processed cheese and cheese spreads. Fats and oils Butter. Stick margarine. Lard. Shortening. Ghee. Bacon fat. Tropical oils, such as coconut, palm kernel, or palm oil. Seasoning and other foods Salted popcorn and pretzels. Onion salt, garlic salt, seasoned salt, table salt, and sea salt. Worcestershire sauce. Tartar sauce. Barbecue sauce. Teriyaki sauce. Soy sauce, including reduced-sodium. Steak sauce. Canned and packaged gravies. Fish sauce. Oyster sauce. Cocktail sauce. Horseradish that you find on the shelf. Ketchup. Mustard. Meat flavorings and tenderizers. Bouillon cubes. Hot sauce and Tabasco sauce. Premade or packaged marinades. Premade or packaged taco seasonings. Relishes. Regular salad dressings. Where to find more information:  National Heart, Lung, and [place]: https://wilson-eaton.com/  American Heart Association: www.heart.org Summary  The DASH eating plan is a healthy  eating plan that has been shown to reduce high blood pressure (hypertension). It may also reduce your risk for type 2 diabetes, heart disease, and stroke.  With the DASH eating plan, you should limit salt (sodium) intake to 2,300 mg a day. If you have hypertension, you may need to reduce your sodium intake to 1,500 mg a day.  When on the DASH eating plan, aim to eat more fresh fruits and vegetables, whole grains, lean proteins, low-fat dairy, and heart-healthy  fats.  Work with your health care provider or diet and nutrition specialist (dietitian) to adjust your eating plan to your individual calorie needs. This information is not intended to replace advice given to you by your health care provider. Make sure you discuss any questions you have with your health care provider. Document Released: 08/05/2011 Document Revised: 08/09/2016 Document Reviewed: 08/09/2016 Elsevier Interactive Patient Education  2019 [employer].   Hypertension Hypertension, commonly called high blood pressure, is when the force of blood pumping through the arteries is too strong. The arteries are the blood vessels that carry blood from the heart throughout the body. Hypertension forces the heart to work harder to pump blood and may cause arteries to become narrow or stiff. Having untreated or uncontrolled hypertension can cause heart attacks, strokes, kidney disease, and other problems. A blood pressure reading consists of a higher number over a lower number. Ideally, your blood pressure should be below 120/80. The first ("top") number is called the systolic pressure. It is a measure of the pressure in your arteries as your heart beats. The second ("bottom") number is called the diastolic pressure. It is a measure of the pressure in your arteries as the heart relaxes. What are the causes? The cause of this condition is not known. What increases the risk? Some risk factors for high blood pressure are under your control. Others are not. Factors you can change  Smoking.  Having type 2 diabetes mellitus, high cholesterol, or both.  Not getting enough exercise or physical activity.  Being overweight.  Having too much fat, sugar, calories, or salt (sodium) in your diet.  Drinking too much alcohol. Factors that are difficult or impossible to change  Having chronic kidney disease.  Having a family history of high blood pressure.  Age. Risk increases with age.  Race. You  may be at higher risk if you are African-American.  Gender. Men are at higher risk than women before age 21. After age 56, women are at higher risk than men.  Having obstructive sleep apnea.  Stress. What are the signs or symptoms? Extremely high blood pressure (hypertensive crisis) may cause:  Headache.  Anxiety.  Shortness of breath.  Nosebleed.  Nausea and vomiting.  Severe chest pain.  Jerky movements you cannot control (seizures). How is this diagnosed? This condition is diagnosed by measuring your blood pressure while you are seated, with your arm resting on a surface. The cuff of the blood pressure monitor will be placed directly against the skin of your upper arm at the level of your heart. It should be measured at least twice using the same arm. Certain conditions can cause a difference in blood pressure between your right and left arms. Certain factors can cause blood pressure readings to be lower or higher than normal (elevated) for a short period of time:  When your blood pressure is higher when you are in a health care provider's office than when you are at home, this is called white coat hypertension. Most  people with this condition do not need medicines.  When your blood pressure is higher at home than when you are in a health care provider's office, this is called masked hypertension. Most people with this condition may need medicines to control blood pressure. If you have a high blood pressure reading during one visit or you have normal blood pressure with other risk factors:  You may be asked to return on a different day to have your blood pressure checked again.  You may be asked to monitor your blood pressure at home for 1 week or longer. If you are diagnosed with hypertension, you may have other blood or imaging tests to help your health care provider understand your overall risk for other conditions. How is this treated? This condition is treated by making  healthy lifestyle changes, such as eating healthy foods, exercising more, and reducing your alcohol intake. Your health care provider may prescribe medicine if lifestyle changes are not enough to get your blood pressure under control, and if:  Your systolic blood pressure is above 130.  Your diastolic blood pressure is above 80. Your personal target blood pressure may vary depending on your medical conditions, your age, and other factors. Follow these instructions at home: Eating and drinking   Eat a diet that is high in fiber and potassium, and low in sodium, added sugar, and fat. An example eating plan is called the DASH (Dietary Approaches to Stop Hypertension) diet. To eat this way: ? Eat plenty of fresh fruits and vegetables. Try to fill half of your plate at each meal with fruits and vegetables. ? Eat whole grains, such as whole wheat pasta, brown rice, or whole grain bread. Fill about one quarter of your plate with whole grains. ? Eat or drink low-fat dairy products, such as skim milk or low-fat yogurt. ? Avoid fatty cuts of meat, processed or cured meats, and poultry with skin. Fill about one quarter of your plate with lean proteins, such as fish, chicken without skin, beans, eggs, and tofu. ? Avoid premade and processed foods. These tend to be higher in sodium, added sugar, and fat.  Reduce your daily sodium intake. Most people with hypertension should eat less than 1,500 mg of sodium a day.  Limit alcohol intake to no more than 1 drink a day for nonpregnant women and 2 drinks a day for men. One drink equals 12 oz of beer, 5 oz of wine, or 1 oz of hard liquor. Lifestyle   Work with your health care provider to maintain a healthy body weight or to lose weight. Ask what an ideal weight is for you.  Get at least 30 minutes of exercise that causes your heart to beat faster (aerobic exercise) most days of the week. Activities may include walking, swimming, or biking.  Include exercise  to strengthen your muscles (resistance exercise), such as pilates or lifting weights, as part of your weekly exercise routine. Try to do these types of exercises for 30 minutes at least 3 days a week.  Do not use any products that contain nicotine or tobacco, such as cigarettes and e-cigarettes. If you need help quitting, ask your health care provider.  Monitor your blood pressure at home as told by your health care provider.  Keep all follow-up visits as told by your health care provider. This is important. Medicines  Take over-the-counter and prescription medicines only as told by your health care provider. Follow directions carefully. Blood pressure medicines must be taken as  prescribed.  Do not skip doses of blood pressure medicine. Doing this puts you at risk for problems and can make the medicine less effective.  Ask your health care provider about side effects or reactions to medicines that you should watch for. Contact a health care provider if:  You think you are having a reaction to a medicine you are taking.  You have headaches that keep coming back (recurring).  You feel dizzy.  You have swelling in your ankles.  You have trouble with your vision. Get help right away if:  You develop a severe headache or confusion.  You have unusual weakness or numbness.  You feel faint.  You have severe pain in your chest or abdomen.  You vomit repeatedly.  You have trouble breathing. Summary  Hypertension is when the force of blood pumping through your arteries is too strong. If this condition is not controlled, it may put you at risk for serious complications.  Your personal target blood pressure may vary depending on your medical conditions, your age, and other factors. For most people, a normal blood pressure is less than 120/80.  Hypertension is treated with lifestyle changes, medicines, or a combination of both. Lifestyle changes include weight loss, eating a healthy,  low-sodium diet, exercising more, and limiting alcohol. This information is not intended to replace advice given to you by your health care provider. Make sure you discuss any questions you have with your health care provider. Document Released: 08/16/2005 Document Revised: 07/14/2016 Document Reviewed: 07/14/2016 Elsevier Interactive Patient Education  2019 [employer].       How to Take Your Blood Pressure You can take your blood pressure at home with a machine. You may need to check your blood pressure at home:  To check if you have high blood pressure (hypertension).  To check your blood pressure over time.  To make sure your blood pressure medicine is working. Supplies needed: You will need a blood pressure machine, or monitor. You can buy one at a drugstore or online. When choosing one:  Choose one with an arm cuff.  Choose one that wraps around your upper arm. Only one finger should fit between your arm and the cuff.  Do not choose one that measures your blood pressure from your wrist or finger. Your doctor can suggest a monitor. How to prepare Avoid these things for 30 minutes before checking your blood pressure:  Drinking caffeine.  Drinking alcohol.  Eating.  Smoking.  Exercising. Five minutes before checking your blood pressure:  Pee.  Sit in a dining chair. Avoid sitting in a soft couch or armchair.  Be quiet. Do not talk. How to take your blood pressure Follow the instructions that came with your machine. If you have a digital blood pressure monitor, these may be the instructions: 1. Sit up straight. 2. Place your feet on the floor. Do not cross your ankles or legs. 3. Rest your left arm at the level of your heart. You may rest it on a table, desk, or chair. 4. Pull up your shirt sleeve. 5. Wrap the blood pressure cuff around the upper part of your left arm. The cuff should be 1 inch (2.5 cm) above your elbow. It is best to wrap the cuff around bare  skin. 6. Fit the cuff snugly around your arm. You should be able to place only one finger between the cuff and your arm. 7. Put the cord inside the groove of your elbow. 8. Press the power  button. 9. Sit quietly while the cuff fills with air and loses air. 10. Write down the numbers on the screen. 11. Wait 2-3 minutes and then repeat steps 1-10. What do the numbers mean? Two numbers make up your blood pressure. The first number is called systolic pressure. The second is called diastolic pressure. An example of a blood pressure reading is "120 over 80" (or 120/80). If you are an adult and do not have a medical condition, use this guide to find out if your blood pressure is normal: Normal  First number: below 120.  Second number: below 80. Elevated  First number: 120-129.  Second number: below 80. Hypertension stage 1  First number: 130-139.  Second number: 80-89. Hypertension stage 2  First number: 140 or above.  Second number: 33 or above. Your blood pressure is above normal even if only the top or bottom number is above normal. Follow these instructions at home:  Check your blood pressure as often as your doctor tells you to.  Take your monitor to your next doctor's appointment. Your doctor will: ? Make sure you are using it correctly. ? Make sure it is working right.  Make sure you understand what your blood pressure numbers should be.  Tell your doctor if your medicines are causing side effects. Contact a doctor if:  Your blood pressure keeps being high. Get help right away if:  Your first blood pressure number is higher than 180.  Your second blood pressure number is higher than 120. This information is not intended to replace advice given to you by your health care provider. Make sure you discuss any questions you have with your health care provider. Document Released: 07/29/2008 Document Revised: 07/14/2016 Document Reviewed: 01/23/2016 Elsevier Interactive  Patient Education  2019 [employer].

## 2019-01-17 NOTE — Progress Notes (Signed)
Jodi Mendez, is a 43 y.o. female  EXH:[id number]  FYB:[id number]  DOB - 11/12/1975  CC:  Chief Complaint  Patient presents with  . Establish Care  . Hypertension       HPI: Jodi Mendez is a 43 y.o. female is here today to establish care.   Jodi Mendez has Menorrhagia; Leiomyoma of uterus; Chronic anemia; Complex cyst of left ovary; and Hypertension on their problem list.   Today's visit:  Jodi Mendez was referred here today to establish care and hypertension management. She was referred by her OB/GYN Dr. Ilda Basset.    Chronic Anemia  She was recently hospitalized back in February for anemia secondary to chronic blood loss secondary to menorrhagia. Hemoglobin dropped to 2.7. She was infused 2 units of PRBC and hemoglobin increased to 8.9. To manage bleeding, patient was prescribe Megace which help signficantly with bleeding. She reports now that goes several days without bleeding and when bleeding occurs volumes are mild. She denies fatigue and is able to perform her normal activities of daily living. Patient is scheduled to have a D/C on 6/3. Gynecological history is significant for fibroids and Complex left ovarian cyst.     Hypertension Reports blood pressure readings have steadily increased over the course of the year. She has been reluctant to accept that she may have hypertension. She has significant family history for hypertension including mother and siblings. She reports other than recent immediately prior to recent hospitalization, she never experienced chest pain, shortness of breath, or headaches and continues to remain asymptomatic. EKG from 10/04/18 was significant for non-specific T and prolonged QT-negative for any ischemic changes.  She is only active of physical activity at work. She is a non-smoker. Current Body mass index is 33.62 kg/m.Never previously prescribed anithypertensive medication.   Current medications: Current Outpatient Medications:  .  ferrous gluconate  (FERGON) 324 MG tablet, Take 1 tablet (324 mg total) by mouth 2 (two) times daily with a meal., Disp: 60 tablet, Rfl: 1 .  megestrol (MEGACE) 40 MG tablet, Take 2 tablets (80 mg total) by mouth 2 (two) times daily., Disp: 120 tablet, Rfl: 0 .  Multiple Vitamin (MULTIVITAMIN WITH MINERALS) TABS, Take 1 tablet by mouth daily. , Disp: , Rfl:  .  senna (SENNA-LAX) 8.6 MG tablet, Take 1 tablet by mouth daily., Disp: , Rfl:  .  amLODipine (NORVASC) 5 MG tablet, Take 1 tablet (5 mg total) by mouth daily., Disp: 30 tablet, Rfl: 2   Pertinent family medical history: family history includes Diabetes in her mother; Hypertension in her mother.   Allergies  Allergen Reactions  . Sulfa Antibiotics Hives and Itching    Itching/hives    Social History   Socioeconomic History  . Marital status: Divorced    Spouse name: Not on file  . Number of children: 0  . Years of education: Not on file  . Highest education level: Some college, no degree  Occupational History  . Not on file  Social Needs  . Financial resource strain: Not on file  . Food insecurity:    Worry: Not on file    Inability: Not on file  . Transportation needs:    Medical: Yes    Non-medical: Yes  Tobacco Use  . Smoking status: Never Smoker  . Smokeless tobacco: Never Used  Substance and Sexual Activity  . Alcohol use: Yes    Comment: social  . Drug use: No  . Sexual activity: Not Currently    Comment: not within the last  couple of months/sexual active with women  Lifestyle  . Physical activity:    Days per week: Not on file    Minutes per session: Not on file  . Stress: Not on file  Relationships  . Social connections:    Talks on phone: Not on file    Gets together: Not on file    Attends religious service: Not on file    Active member of club or organization: Not on file    Attends meetings of clubs or organizations: Not on file    Relationship status: Not on file  . Intimate partner violence:    Fear of current  or ex partner: Not on file    Emotionally abused: Not on file    Physically abused: Not on file    Forced sexual activity: Not on file  Other Topics Concern  . Not on file  Social History Narrative  . Not on file    Review of Systems: Pertinent negatives listed in HPI   Objective:   Vitals:   01/17/19 1017  BP: (!) 157/103  Pulse: (!) 56  Resp: 17  Temp: 98.3 F (36.8 C)  SpO2: 98%    BP Readings from Last 3 Encounters:  01/17/19 (!) 157/103  11/06/18 (!) 162/90  10/13/18 (!) 162/102    Filed Weights   01/17/19 1017  Weight: 189 lb 12.8 oz (86.1 kg)      Physical Exam: General appearance: alert, well developed, well nourished, cooperative and in no distress Head: Normocephalic, without obvious abnormality, atraumatic Respiratory: Respirations even and unlabored, normal respiratory rate Heart: rate and rhythm normal. No gallop or murmurs noted on exam  Abdomen: BS +, no distention, no rebound tenderness, or no mass Extremities: No gross deformities Skin: Skin color, texture, turgor normal. No rashes seen  Psych: Appropriate mood and affect. Neurologic: Mental status: Alert, oriented to person, place, and time, thought content appropriate. Lab Results (prior encounters)  Lab Results  Component Value Date   WBC 4.6 11/06/2018   HGB 8.9 (L) 11/06/2018   HCT 28.9 (L) 11/06/2018   MCV 81 11/06/2018   PLT 423 11/06/2018   Lab Results  Component Value Date   CREATININE 0.63 10/06/2018   BUN 10 10/06/2018   NA 135 10/06/2018   K 4.3 10/06/2018   CL 109 10/06/2018   CO2 23 10/06/2018      Assessment and plan:  1. Encounter to establish care   2. Screening for blood or protein in urine - POCT URINALYSIS DIP (CLINITEK) - POCT urine pregnancy  3. Hypertension, unspecified type, Asymptomatic, New  -Start Amlodipine 5 mg once daily. She has access to BP cuff at home and will check periodically.  -She will f/u here in 10 days for blood pressure check given  upcoming surgery is scheduled for 01/31/19. -We have discussed target BP range and blood pressure goal. I have advised patient to check BP regularly and to call us back or report to clinic if the numbers are consistently higher than 140/90. We discussed the importance of compliance with medical therapy and DASH diet recommended, consequences of uncontrolled hypertension discussed.  Checking the following: - Comprehensive metabolic panel  4. Chronic anemia,  Hemoglobin 8.9, 11/06/18 Repeating CBC with Differential  5. Screening for thyroid disorder - TSH  6. Screening, lipid - Lipid panel  7. Screening for diabetes mellitus - Hemoglobin A1c  Will follow-up via phone regarding today's lab results. Return in about 8 days (around 01/25/2019) for blood pressure  check.   The patient was given clear instructions to go to ER or return to medical center if symptoms don't improve, worsen or new problems develop. The patient verbalized understanding. The patient was advised  to call and obtain lab results if they haven't heard anything from out office within 7-10 business days.  Molli Barrows, FNP Primary Care at [facility] [address], [place] [address] [phone]: [phone]    This note has been created with Dragon speech recognition software and Engineer, materials. Any transcriptional errors are unintentional.

## 2019-01-18 ENCOUNTER — Telehealth (INDEPENDENT_AMBULATORY_CARE_PROVIDER_SITE_OTHER): Payer: Self-pay | Admitting: Obstetrics and Gynecology

## 2019-01-18 DIAGNOSIS — D219 Benign neoplasm of connective and other soft tissue, unspecified: Secondary | ICD-10-CM

## 2019-01-18 LAB — CBC WITH DIFFERENTIAL/PLATELET
Basophils Absolute: 0 10*3/uL (ref 0.0–0.2)
Basos: 1 %
EOS (ABSOLUTE): 0 10*3/uL (ref 0.0–0.4)
Eos: 0 %
Hematocrit: 44.7 % (ref 34.0–46.6)
Hemoglobin: 14.3 g/dL (ref 11.1–15.9)
Immature Grans (Abs): 0 10*3/uL (ref 0.0–0.1)
Immature Granulocytes: 0 %
Lymphocytes Absolute: 1.6 10*3/uL (ref 0.7–3.1)
Lymphs: 50 %
MCH: 26.7 pg (ref 26.6–33.0)
MCHC: 32 g/dL (ref 31.5–35.7)
MCV: 83 fL (ref 79–97)
Monocytes Absolute: 0.2 10*3/uL (ref 0.1–0.9)
Monocytes: 7 %
Neutrophils Absolute: 1.3 10*3/uL — ABNORMAL LOW (ref 1.4–7.0)
Neutrophils: 42 %
Platelets: 263 10*3/uL (ref 150–450)
RBC: 5.36 x10E6/uL — ABNORMAL HIGH (ref 3.77–5.28)
RDW: 17.6 % — ABNORMAL HIGH (ref 11.7–15.4)
WBC: 3.2 10*3/uL — ABNORMAL LOW (ref 3.4–10.8)

## 2019-01-18 LAB — COMPREHENSIVE METABOLIC PANEL
ALT: 23 IU/L (ref 0–32)
AST: 22 IU/L (ref 0–40)
Albumin/Globulin Ratio: 1.4 (ref 1.2–2.2)
Albumin: 4.8 g/dL (ref 3.8–4.8)
Alkaline Phosphatase: 85 IU/L (ref 39–117)
BUN/Creatinine Ratio: 9 (ref 9–23)
BUN: 7 mg/dL (ref 6–24)
Bilirubin Total: 0.4 mg/dL (ref 0.0–1.2)
CO2: 24 mmol/L (ref 20–29)
Calcium: 9.4 mg/dL (ref 8.7–10.2)
Chloride: 100 mmol/L (ref 96–106)
Creatinine, Ser: 0.77 mg/dL (ref 0.57–1.00)
GFR calc Af Amer: 110 mL/min/{1.73_m2} (ref >59)
GFR calc non Af Amer: 96 mL/min/{1.73_m2} (ref >59)
Globulin, Total: 3.5 g/dL (ref 1.5–4.5)
Glucose: 71 mg/dL (ref 65–99)
Potassium: 4.2 mmol/L (ref 3.5–5.2)
Sodium: 138 mmol/L (ref 134–144)
Total Protein: 8.3 g/dL (ref 6.0–8.5)

## 2019-01-18 LAB — HEMOGLOBIN A1C
Est. average glucose Bld gHb Est-mCnc: 111 mg/dL
Hgb A1c MFr Bld: 5.5 % (ref 4.8–5.6)

## 2019-01-18 LAB — LIPID PANEL
Chol/HDL Ratio: 2.6 ratio (ref 0.0–4.4)
Cholesterol, Total: 193 mg/dL (ref 100–199)
HDL: 74 mg/dL (ref >39)
LDL Calculated: 106 mg/dL — ABNORMAL HIGH (ref 0–99)
Triglycerides: 67 mg/dL (ref 0–149)
VLDL Cholesterol Cal: 13 mg/dL (ref 5–40)

## 2019-01-18 LAB — TSH: TSH: 4.16 u[IU]/mL (ref 0.450–4.500)

## 2019-01-18 NOTE — Progress Notes (Signed)
TELEHEALTH VIRTUAL GYNECOLOGY VISIT ENCOUNTER NOTE  I connected with Jodi Mendez on 01/18/19 at  1:55 PM EDT by telephone at home and verified that I am speaking with the correct person using two identifiers.   I discussed the limitations, risks, security and privacy concerns of performing an evaluation and management service by telephone and the availability of in person appointments. I also discussed with the patient that there may be a patient responsible charge related to this service. The patient expressed understanding and agreed to proceed.   Obstetrics and Gynecology Established Patient Evaluation  Appointment Date: 01/18/2019  OBGYN Clinic: [facility]  Primary Care Provider: Scot Jun  Chief Complaint:  Chief Complaint  Patient presents with  . uterine bleeding    History of Present Illness: Jodi Mendez is a 43 y.o. African-American [id number] (No LMP recorded. (Menstrual status: Irregular Periods).), seen for the above chief complaint. Her past medical history is significant for HTN, fibroids anemia   No current bleeding and only has rare blood clot.   Review of Systems:  as noted in the History of Present Illness.  Patient Active Problem List   Diagnosis Date Noted  . Hypertension 11/06/2018  . Complex cyst of left ovary 10/06/2018  . Chronic anemia 10/05/2018  . Menorrhagia 02/09/2013  . Leiomyoma of uterus 02/09/2013     Past Medical History:  Past Medical History:  Diagnosis Date  . Anemia   . Hypertension 11/06/2018  . Transient hypertension     Past Surgical History:  Past Surgical History:  Procedure Laterality Date  . UNILATERAL SALPINGECTOMY Left 2007   Ectopic pregnancy. suprapubic mini-lap    Past Obstetrical History:  OB History  Gravida Para Term Preterm AB Living  1 0     1    SAB TAB Ectopic Multiple Live Births      1        # Outcome Date GA Lbr Len/2nd Weight Sex Delivery Anes PTL Lv  1 Ectopic             Past Gynecological History: As per HPI. History of Pap Smear(s): Yes.   Last pap 2017, which was negative and hpv negative  Social History:  Social History   Socioeconomic History  . Marital status: Divorced    Spouse name: Not on file  . Number of children: 0  . Years of education: Not on file  . Highest education level: Some college, no degree  Occupational History  . Not on file  Social Needs  . Financial resource strain: Not on file  . Food insecurity:    Worry: Not on file    Inability: Not on file  . Transportation needs:    Medical: Yes    Non-medical: Yes  Tobacco Use  . Smoking status: Never Smoker  . Smokeless tobacco: Never Used  Substance and Sexual Activity  . Alcohol use: Yes    Comment: social  . Drug use: No  . Sexual activity: Not Currently    Comment: not within the last couple of months/sexual active with women  Lifestyle  . Physical activity:    Days per week: Not on file    Minutes per session: Not on file  . Stress: Not on file  Relationships  . Social connections:    Talks on phone: Not on file    Gets together: Not on file    Attends religious service: Not on file    Active member of club  or organization: Not on file    Attends meetings of clubs or organizations: Not on file    Relationship status: Not on file  . Intimate partner violence:    Fear of current or ex partner: Not on file    Emotionally abused: Not on file    Physically abused: Not on file    Forced sexual activity: Not on file  Other Topics Concern  . Not on file  Social History Narrative  . Not on file    Family History:  Family History  Problem Relation Age of Onset  . Diabetes Mother   . Hypertension Mother     Medications [name] had no medications administered during this visit. Current Outpatient Medications  Medication Sig Dispense Refill  . amLODipine (NORVASC) 5 MG tablet Take 1 tablet (5 mg total) by mouth daily. 30 tablet 2  .  ferrous gluconate (FERGON) 324 MG tablet Take 1 tablet (324 mg total) by mouth 2 (two) times daily with a meal. 60 tablet 1  . megestrol (MEGACE) 40 MG tablet Take 2 tablets (80 mg total) by mouth 2 (two) times daily. 120 tablet 0  . Multiple Vitamin (MULTIVITAMIN WITH MINERALS) TABS Take 1 tablet by mouth daily.     Marland Kitchen senna (SENNA-LAX) 8.6 MG tablet Take 1 tablet by mouth daily.     No current facility-administered medications for this visit.     Allergies Sulfa antibiotics   Physical Exam:  There were no vitals taken for this visit. There is no height or weight on file to calculate BMI. General appearance: Well nourished, well developed female in no acute distress.   Laboratory: none  Radiology:  CLINICAL DATA:  Follow-up complex LEFT ovarian cyst  EXAM: ULTRASOUND PELVIS TRANSVAGINAL  TECHNIQUE: Transvaginal ultrasound examination of the pelvis was performed including evaluation of the uterus, ovaries, adnexal regions, and pelvic cul-de-sac.  COMPARISON:  10/05/2018  FINDINGS: Uterus  Measurements: 13.5 x 8.1 x 7.6 cm = volume: 434 mL. Anteverted. Heterogeneous echogenicity. Large central mass within the uterus 4.5 x 4.0 x 4.1 cm compatible with leiomyoma. This extend submucosal and distorts the endometrial complex. Probable additional small uterine leiomyomata.  Endometrium  Thickness: 13 mm. Poorly visualized due to distortion by large central uterine mass period  Right ovary  Measurements: 3.2 x 1.5 x 1.5 cm = volume: 3.7 mL. Normal morphology without mass  Left ovary  Measurements: 3.2 x 2.1 x 2.2 cm = volume: 7.7 mL. Hypoechoic nodule 1.9 x 1.2 x 1.2 cm containing scattered internal echogenicity question small hemorrhagic cyst; this has decreased in size since the prior exam when it measured 3.6 x 3.1 x 2.2 cm. No additional masses.  Other findings:  Trace free pelvic fluid.  IMPRESSION: Enlarged uterus containing leiomyomata including  a 4.5 cm diameter central submucosal leiomyoma.  Interval decrease in size of hypoechoic complicated cyst within LEFT ovary likely representing an evolving hemorrhagic cyst.  No new intrapelvic abnormalities.   Electronically Signed   By: Lavonia Dana M.D.   On: 10/19/2018 13:16  Assessment: pt doing well  Plan:  Pt states she's started the bp medication and has 1wk rpt scheduled. Questions re: surgery asked and answered.  Surgery scheduled for 6/3 with plan for hysteroscopy, myomectomy RTC post op  I provided 15 minutes of non-face-to-face time during this encounter. The visit was done via a MyChart Virtual visit.   Durene Romans MD Attending Center for [facility])

## 2019-01-23 ENCOUNTER — Encounter: Payer: Self-pay | Admitting: Family Medicine

## 2019-01-23 LAB — MAGNESIUM

## 2019-01-24 ENCOUNTER — Telehealth: Payer: Self-pay

## 2019-01-24 ENCOUNTER — Encounter (HOSPITAL_BASED_OUTPATIENT_CLINIC_OR_DEPARTMENT_OTHER): Payer: Self-pay | Admitting: *Deleted

## 2019-01-24 ENCOUNTER — Other Ambulatory Visit: Payer: Self-pay

## 2019-01-24 NOTE — Telephone Encounter (Signed)

## 2019-01-25 ENCOUNTER — Ambulatory Visit (INDEPENDENT_AMBULATORY_CARE_PROVIDER_SITE_OTHER): Payer: Self-pay | Admitting: Family Medicine

## 2019-01-25 ENCOUNTER — Ambulatory Visit: Payer: Self-pay

## 2019-01-25 ENCOUNTER — Encounter: Payer: Self-pay | Admitting: Family Medicine

## 2019-01-25 VITALS — BP 159/98 | HR 56 | Temp 98.2°F | Resp 17 | Ht 63.0 in | Wt 189.0 lb

## 2019-01-25 VITALS — BP 154/97 | HR 97

## 2019-01-25 DIAGNOSIS — I10 Essential (primary) hypertension: Secondary | ICD-10-CM

## 2019-01-25 MED ORDER — AMLODIPINE BESYLATE 5 MG PO TABS: 10.0000 mg | ORAL_TABLET | Freq: Every day | ORAL | 2 refills | Status: DC

## 2019-01-25 NOTE — Progress Notes (Signed)
Patient ID: Jodi Mendez, female    DOB: [birth year], 43 y.o.   MRN: [id number]  PCP: Scot Jun, FNP  Chief Complaint  Patient presents with  . Hypertension    Subjective:  HPI Jodi Mendez is a 43 y.o. female presents for hypertension follow-up. She has not taken her medication this morning and just gotten off work. Her blood pressure today remains elevated and not at goal. She had changed the time she takes the blood pressure medication as she reports the medication caused the abnormal uterine bleeding to worsen and become heavier.  She is scheduled for procedure with OB/GYN on June 3 however her blood pressure must improve before they can look for up with procedure. Social History   Socioeconomic History  . Marital status: Divorced    Spouse name: Not on file  . Number of children: 0  . Years of education: Not on file  . Highest education level: Some college, no degree  Occupational History  . Not on file  Social Needs  . Financial resource strain: Not on file  . Food insecurity:    Worry: Not on file    Inability: Not on file  . Transportation needs:    Medical: Yes    Non-medical: Yes  Tobacco Use  . Smoking status: Never Smoker  . Smokeless tobacco: Never Used  Substance and Sexual Activity  . Alcohol use: Yes    Comment: social  . Drug use: No    Comment: "edible a time or two, nothing current"  . Sexual activity: Not Currently    Comment: not within the last couple of months/sexual active with women  Lifestyle  . Physical activity:    Days per week: Not on file    Minutes per session: Not on file  . Stress: Not on file  Relationships  . Social connections:    Talks on phone: Not on file    Gets together: Not on file    Attends religious service: Not on file    Active member of club or organization: Not on file    Attends meetings of clubs or organizations: Not on file    Relationship status: Not on file  . Intimate partner violence:   Fear of current or ex partner: Not on file    Emotionally abused: Not on file    Physically abused: Not on file    Forced sexual activity: Not on file  Other Topics Concern  . Not on file  Social History Narrative  . Not on file    Family History  Problem Relation Age of Onset  . Diabetes Mother   . Hypertension Mother    Review of Systems Pertinent negatives listed in HPI Patient Active Problem List   Diagnosis Date Noted  . Hypertension 11/06/2018  . Complex cyst of left ovary 10/06/2018  . Chronic anemia 10/05/2018  . Menorrhagia 02/09/2013  . Leiomyoma of uterus 02/09/2013    Allergies  Allergen Reactions  . Sulfa Antibiotics Hives and Itching    Itching/hives    Prior to Admission medications   Medication Sig Start Date End Date Taking? Authorizing Provider  amLODipine (NORVASC) 5 MG tablet Take 1 tablet (5 mg total) by mouth daily. 01/17/19  Yes Scot Jun, FNP  ferrous gluconate (FERGON) 324 MG tablet Take 1 tablet (324 mg total) by mouth 2 (two) times daily with a meal. 01/08/19  Yes Aletha Halim, MD  megestrol (MEGACE) 40 MG tablet Take 2 tablets (80  mg total) by mouth 2 (two) times daily. 12/27/18  Yes Aletha Halim, MD  Multiple Vitamin (MULTIVITAMIN WITH MINERALS) TABS Take 1 tablet by mouth daily.    Yes [provider]  senna (SENNA-LAX) 8.6 MG tablet Take 1 tablet by mouth daily.   Yes [provider]    Past Medical, Surgical Family and Social History reviewed and updated.    Objective:   Today's Vitals   01/25/19 0857  BP: (!) 159/98  Pulse: (!) 56  Resp: 17  Temp: 98.2 F (36.8 C)  TempSrc: Temporal  SpO2: 98%  Weight: 189 lb (85.7 kg)  Height: 5\' 3"  (1.6 m)    Wt Readings from Last 3 Encounters:  01/25/19 189 lb (85.7 kg)  01/17/19 189 lb 12.8 oz (86.1 kg)  11/06/18 178 lb 6.4 oz (80.9 kg)     Physical Exam General appearance: alert, well developed, well nourished, cooperative and in no distress Head:  Normocephalic, without obvious abnormality, atraumatic Respiratory: Respirations even and unlabored, normal respiratory rate Heart: rate and rhythm normal. No gallop or murmurs noted on exam  Extremities: No gross deformities Skin: Skin color, texture, turgor normal. No rashes seen  Psych: Appropriate mood and affect. Neurologic: Mental status: Alert, oriented to person, place, and time, thought content appropriate.  No results found for: POCGLU  Lab Results  Component Value Date   HGBA1C 5.5 01/17/2019       Assessment & Plan:  1. Hypertension, unspecified type -Patient agreed to go home and take blood pressure medication and return this afternoon for a repeat blood pressure check as she does not have a blood pressure monitor at home.  If blood pressure remains elevated will increase amlodipine to 10 mg and have patient return on Monday, 01/29/2019 for repeat blood pressure evaluation. Goal is for blood pressure to be less than 150/90 for patient to safely undergo procedure.   Patient return to office for a blood pressure check. Blood pressure remained elevated 154/97. Increased Amlodipine 10 mg once daily. Return on 01/29/2019 for repeat blood pressure check.   -The patient was given clear instructions to go to ER or return to medical center if symptoms do not improve, worsen or new problems develop. The patient verbalized understanding.    Molli Barrows, FNP Primary Care at [facility] [address], [place] [phone]: [phone]

## 2019-01-25 NOTE — Progress Notes (Signed)
Patient here for BP check. Has taken BP medication. BP 154/97, pulse 97. Spoke with provider & she states to have patient increase Amlodipine to 10 mg daily & follow up Monday for BP check. KWalker, CMA.

## 2019-01-26 ENCOUNTER — Telehealth: Payer: Self-pay | Admitting: Obstetrics and Gynecology

## 2019-01-26 NOTE — Telephone Encounter (Signed)
GYN Telephone Note   Patient called at [phone] in response to MyChart message from clinic re: her VB.   Generic VM picked up and I told it that I would reply to her on MyChart and that I'm working tomorrow so will check it if she has any questions or issues  Durene Romans MD Attending Center for [facility] (Faculty Practice) 01/26/2019 Time: [phone]

## 2019-01-29 ENCOUNTER — Encounter (HOSPITAL_BASED_OUTPATIENT_CLINIC_OR_DEPARTMENT_OTHER)
Admission: RE | Admit: 2019-01-29 | Discharge: 2019-01-29 | Disposition: A | Discharge status: Home / Self Care | Payer: Self-pay | Source: Ambulatory Visit | Attending: Obstetrics and Gynecology | Admitting: Obstetrics and Gynecology

## 2019-01-29 ENCOUNTER — Other Ambulatory Visit (HOSPITAL_COMMUNITY)
Admission: RE | Admit: 2019-01-29 | Discharge: 2019-01-29 | Disposition: A | Discharge status: Home / Self Care | Payer: HRSA Program | Source: Ambulatory Visit | Attending: Obstetrics and Gynecology | Admitting: Obstetrics and Gynecology

## 2019-01-29 ENCOUNTER — Other Ambulatory Visit: Payer: Self-pay

## 2019-01-29 ENCOUNTER — Encounter: Payer: Self-pay | Admitting: Family Medicine

## 2019-01-29 ENCOUNTER — Ambulatory Visit (INDEPENDENT_AMBULATORY_CARE_PROVIDER_SITE_OTHER): Payer: Self-pay

## 2019-01-29 VITALS — BP 128/84 | HR 65

## 2019-01-29 DIAGNOSIS — Z1159 Encounter for screening for other viral diseases: Secondary | ICD-10-CM | POA: Diagnosis present

## 2019-01-29 DIAGNOSIS — Z0130 Encounter for examination of blood pressure without abnormal findings: Secondary | ICD-10-CM

## 2019-01-29 DIAGNOSIS — Z01812 Encounter for preprocedural laboratory examination: Secondary | ICD-10-CM | POA: Insufficient documentation

## 2019-01-29 DIAGNOSIS — I10 Essential (primary) hypertension: Secondary | ICD-10-CM

## 2019-01-29 LAB — POCT PREGNANCY, URINE: Preg Test, Ur: NEGATIVE

## 2019-01-29 NOTE — Progress Notes (Signed)
Patient here for BP check. Has taken Amlodipine 10 mg this morning. After sitting BP was 128/84 & pulse 65. Spoke with provider & she states to have patient continue current medications and follow up in office with her in 1 month. KWalker, CMA.

## 2019-01-29 NOTE — Progress Notes (Signed)
POCT Urine pregnancy test negative 01/29/2019.

## 2019-01-30 ENCOUNTER — Other Ambulatory Visit: Payer: Self-pay | Admitting: Obstetrics and Gynecology

## 2019-01-31 ENCOUNTER — Ambulatory Visit (HOSPITAL_BASED_OUTPATIENT_CLINIC_OR_DEPARTMENT_OTHER): Payer: Self-pay | Admitting: Certified Registered Nurse Anesthetist

## 2019-01-31 ENCOUNTER — Encounter (HOSPITAL_BASED_OUTPATIENT_CLINIC_OR_DEPARTMENT_OTHER): Payer: Self-pay | Admitting: *Deleted

## 2019-01-31 ENCOUNTER — Other Ambulatory Visit: Payer: Self-pay

## 2019-01-31 ENCOUNTER — Telehealth: Payer: Self-pay

## 2019-01-31 ENCOUNTER — Encounter (HOSPITAL_BASED_OUTPATIENT_CLINIC_OR_DEPARTMENT_OTHER)
Admission: RE | Disposition: A | Discharge status: Home / Self Care | Payer: Self-pay | Source: Home / Self Care | Attending: Obstetrics and Gynecology

## 2019-01-31 ENCOUNTER — Ambulatory Visit (HOSPITAL_BASED_OUTPATIENT_CLINIC_OR_DEPARTMENT_OTHER)
Admission: RE | Admit: 2019-01-31 | Discharge: 2019-01-31 | Disposition: A | Discharge status: Home / Self Care | Payer: Self-pay | Attending: Obstetrics and Gynecology | Admitting: Obstetrics and Gynecology

## 2019-01-31 DIAGNOSIS — Z6833 Body mass index (BMI) 33.0-33.9, adult: Secondary | ICD-10-CM | POA: Insufficient documentation

## 2019-01-31 DIAGNOSIS — D649 Anemia, unspecified: Secondary | ICD-10-CM | POA: Insufficient documentation

## 2019-01-31 DIAGNOSIS — D250 Submucous leiomyoma of uterus: Secondary | ICD-10-CM

## 2019-01-31 DIAGNOSIS — Z79899 Other long term (current) drug therapy: Secondary | ICD-10-CM | POA: Insufficient documentation

## 2019-01-31 DIAGNOSIS — E669 Obesity, unspecified: Secondary | ICD-10-CM | POA: Insufficient documentation

## 2019-01-31 DIAGNOSIS — Z9889 Other specified postprocedural states: Secondary | ICD-10-CM

## 2019-01-31 DIAGNOSIS — I10 Essential (primary) hypertension: Secondary | ICD-10-CM | POA: Insufficient documentation

## 2019-01-31 HISTORY — PX: DILATATION & CURETTAGE/HYSTEROSCOPY WITH MYOSURE: SHX6511

## 2019-01-31 LAB — NOVEL CORONAVIRUS, NAA (HOSP ORDER, SEND-OUT TO REF LAB; TAT 18-24 HRS): SARS-CoV-2, NAA: NOT DETECTED

## 2019-01-31 SURGERY — DILATATION & CURETTAGE/HYSTEROSCOPY WITH MYOSURE
Anesthesia: General | Site: Uterus

## 2019-01-31 MED ORDER — PROPOFOL 10 MG/ML IV BOLUS
INTRAVENOUS | Status: AC
  Filled 2019-01-31: qty 20

## 2019-01-31 MED ORDER — SOD CITRATE-CITRIC ACID 500-334 MG/5ML PO SOLN
30.0000 mL | ORAL | Status: AC
  Administered 2019-01-31: 30 mL via ORAL
  Filled 2019-01-31: qty 30

## 2019-01-31 MED ORDER — MIDAZOLAM HCL 5 MG/5ML IJ SOLN
INTRAMUSCULAR | Status: DC | PRN
  Administered 2019-01-31: 2 mg via INTRAVENOUS

## 2019-01-31 MED ORDER — LACTATED RINGERS IV SOLN
INTRAVENOUS | Status: DC
  Administered 2019-01-31: 10:00:00 via INTRAVENOUS

## 2019-01-31 MED ORDER — LIDOCAINE HCL 1 % IJ SOLN
INTRAMUSCULAR | Status: DC | PRN
  Administered 2019-01-31: 20 mL

## 2019-01-31 MED ORDER — OXYCODONE-ACETAMINOPHEN 5-325 MG PO TABS: 1.0000 | ORAL_TABLET | Freq: Four times a day (QID) | ORAL | 0 refills | Status: DC | PRN

## 2019-01-31 MED ORDER — MIDAZOLAM HCL 2 MG/2ML IJ SOLN
INTRAMUSCULAR | Status: AC
  Filled 2019-01-31: qty 2

## 2019-01-31 MED ORDER — FENTANYL CITRATE (PF) 100 MCG/2ML IJ SOLN
25.0000 ug | INTRAMUSCULAR | Status: DC | PRN
  Administered 2019-01-31 (×2): 50 ug via INTRAVENOUS

## 2019-01-31 MED ORDER — FENTANYL CITRATE (PF) 100 MCG/2ML IJ SOLN
INTRAMUSCULAR | Status: DC | PRN
  Administered 2019-01-31 (×2): 25 ug via INTRAVENOUS

## 2019-01-31 MED ORDER — OXYCODONE HCL 5 MG PO TABS: 5.0000 mg | ORAL_TABLET | Freq: Once | ORAL | Status: DC | PRN

## 2019-01-31 MED ORDER — SILVER NITRATE-POT NITRATE 75-25 % EX MISC
CUTANEOUS | Status: DC | PRN
  Administered 2019-01-31: 3 via TOPICAL

## 2019-01-31 MED ORDER — LACTATED RINGERS IV SOLN: INTRAVENOUS | Status: DC

## 2019-01-31 MED ORDER — DEXAMETHASONE SODIUM PHOSPHATE 10 MG/ML IJ SOLN
INTRAMUSCULAR | Status: DC | PRN
  Administered 2019-01-31: 10 mg via INTRAVENOUS

## 2019-01-31 MED ORDER — LIDOCAINE 2% (20 MG/ML) 5 ML SYRINGE
INTRAMUSCULAR | Status: DC | PRN
  Administered 2019-01-31: 60 mg via INTRAVENOUS

## 2019-01-31 MED ORDER — SUCCINYLCHOLINE CHLORIDE 200 MG/10ML IV SOSY
PREFILLED_SYRINGE | INTRAVENOUS | Status: AC
  Filled 2019-01-31: qty 10

## 2019-01-31 MED ORDER — PROPOFOL 10 MG/ML IV BOLUS
INTRAVENOUS | Status: DC | PRN
  Administered 2019-01-31: 200 mg via INTRAVENOUS

## 2019-01-31 MED ORDER — BUPIVACAINE HCL (PF) 0.5 % IJ SOLN
INTRAMUSCULAR | Status: AC
  Filled 2019-01-31: qty 30

## 2019-01-31 MED ORDER — FENTANYL CITRATE (PF) 100 MCG/2ML IJ SOLN: 50.0000 ug | INTRAMUSCULAR | Status: DC | PRN

## 2019-01-31 MED ORDER — ONDANSETRON HCL 4 MG/2ML IJ SOLN
INTRAMUSCULAR | Status: DC | PRN
  Administered 2019-01-31: 4 mg via INTRAVENOUS

## 2019-01-31 MED ORDER — ACETAMINOPHEN 325 MG PO TABS: 325.0000 mg | ORAL_TABLET | Freq: Once | ORAL | Status: DC | PRN

## 2019-01-31 MED ORDER — ROCURONIUM BROMIDE 10 MG/ML (PF) SYRINGE
PREFILLED_SYRINGE | INTRAVENOUS | Status: AC
  Filled 2019-01-31: qty 10

## 2019-01-31 MED ORDER — FENTANYL CITRATE (PF) 100 MCG/2ML IJ SOLN
INTRAMUSCULAR | Status: AC
  Filled 2019-01-31: qty 2

## 2019-01-31 MED ORDER — SCOPOLAMINE 1 MG/3DAYS TD PT72: 1.0000 | MEDICATED_PATCH | Freq: Once | TRANSDERMAL | Status: DC | PRN

## 2019-01-31 MED ORDER — SOD CITRATE-CITRIC ACID 500-334 MG/5ML PO SOLN
ORAL | Status: AC
  Filled 2019-01-31: qty 15

## 2019-01-31 MED ORDER — LIDOCAINE 2% (20 MG/ML) 5 ML SYRINGE
INTRAMUSCULAR | Status: AC
  Filled 2019-01-31: qty 5

## 2019-01-31 MED ORDER — MIDAZOLAM HCL 2 MG/2ML IJ SOLN: 1.0000 mg | INTRAMUSCULAR | Status: DC | PRN

## 2019-01-31 MED ORDER — ACETAMINOPHEN 160 MG/5ML PO SOLN: 325.0000 mg | Freq: Once | ORAL | Status: DC | PRN

## 2019-01-31 MED ORDER — PROMETHAZINE HCL 25 MG/ML IJ SOLN: 6.2500 mg | INTRAMUSCULAR | Status: DC | PRN

## 2019-01-31 MED ORDER — PHENYLEPHRINE 40 MCG/ML (10ML) SYRINGE FOR IV PUSH (FOR BLOOD PRESSURE SUPPORT)
PREFILLED_SYRINGE | INTRAVENOUS | Status: AC
  Filled 2019-01-31: qty 10

## 2019-01-31 MED ORDER — MEPERIDINE HCL 25 MG/ML IJ SOLN: 6.2500 mg | INTRAMUSCULAR | Status: DC | PRN

## 2019-01-31 MED ORDER — OXYCODONE HCL 5 MG/5ML PO SOLN: 5.0000 mg | Freq: Once | ORAL | Status: DC | PRN

## 2019-01-31 MED ORDER — ACETAMINOPHEN 10 MG/ML IV SOLN: 1000.0000 mg | Freq: Once | INTRAVENOUS | Status: DC | PRN

## 2019-01-31 SURGICAL SUPPLY — 66 items
ADH SKN CLS APL DERMABOND .7 (GAUZE/BANDAGES/DRESSINGS)
APL SWBSTK 6 STRL LF DISP (MISCELLANEOUS)
APPLICATOR COTTON TIP 6 STRL (MISCELLANEOUS) ×1 IMPLANT
APPLICATOR COTTON TIP 6IN STRL (MISCELLANEOUS)
BAG SPEC RTRVL LRG 6X4 10 (ENDOMECHANICALS)
BLADE SURG 15 STRL LF DISP TIS (BLADE) ×1 IMPLANT
BLADE SURG 15 STRL SS (BLADE)
BRIEF STRETCH FOR OB PAD XXL (UNDERPADS AND DIAPERS) ×4 IMPLANT
CABLE HIGH FREQUENCY MONO STRZ (ELECTRODE) IMPLANT
CANISTER SUCT 3000ML PPV (MISCELLANEOUS) ×1 IMPLANT
CATH ROBINSON RED A/P 16FR (CATHETERS) ×4 IMPLANT
DEFOGGER SCOPE WARMER CLEARIFY (MISCELLANEOUS) ×1 IMPLANT
DERMABOND ADVANCED (GAUZE/BANDAGES/DRESSINGS)
DERMABOND ADVANCED .7 DNX12 (GAUZE/BANDAGES/DRESSINGS) ×1 IMPLANT
DEVICE MYOSURE LITE (MISCELLANEOUS) IMPLANT
DEVICE MYOSURE REACH (MISCELLANEOUS) IMPLANT
DILATOR CANAL MILEX (MISCELLANEOUS) IMPLANT
DRSG OPSITE POSTOP 3X4 (GAUZE/BANDAGES/DRESSINGS) ×1 IMPLANT
DRSG TELFA 3X8 NADH (GAUZE/BANDAGES/DRESSINGS) IMPLANT
DURAPREP 26ML APPLICATOR (WOUND CARE) ×1 IMPLANT
ELECT REM PT RETURN 9FT ADLT (ELECTROSURGICAL)
ELECTRODE REM PT RTRN 9FT ADLT (ELECTROSURGICAL) ×1 IMPLANT
GLOVE BIOGEL PI IND STRL 7.0 (GLOVE) ×3 IMPLANT
GLOVE BIOGEL PI IND STRL 7.5 (GLOVE) ×1 IMPLANT
GLOVE BIOGEL PI INDICATOR 7.0 (GLOVE) ×2
GLOVE BIOGEL PI INDICATOR 7.5 (GLOVE)
GLOVE SURG SS PI 7.0 STRL IVOR (GLOVE) ×8 IMPLANT
GOWN STRL REUS W/TWL LRG LVL3 (GOWN DISPOSABLE) ×6 IMPLANT
GOWN STRL REUS W/TWL XL LVL3 (GOWN DISPOSABLE) ×7 IMPLANT
KIT PROCEDURE FLUENT (KITS) ×4 IMPLANT
LIGASURE VESSEL 5MM BLUNT TIP (ELECTROSURGICAL) IMPLANT
MYOSURE XL FIBROID (MISCELLANEOUS) ×4
NDL INSUFF ACCESS 14 VERSASTEP (NEEDLE) IMPLANT
NS IRRIG 1000ML POUR BTL (IV SOLUTION) ×1 IMPLANT
PACK LAPAROSCOPY BASIN (CUSTOM PROCEDURE TRAY) ×1 IMPLANT
PACK TRENDGUARD 450 HYBRID PRO (MISCELLANEOUS) IMPLANT
PACK TRENDGUARD 600 HYBRD PROC (MISCELLANEOUS) IMPLANT
PACK VAGINAL MINOR WOMEN LF (CUSTOM PROCEDURE TRAY) ×4 IMPLANT
PAD DRESSING TELFA 3X8 NADH (GAUZE/BANDAGES/DRESSINGS) ×1 IMPLANT
PAD OB MATERNITY 4.3X12.25 (PERSONAL CARE ITEMS) ×4 IMPLANT
PAD PREP 24X48 CUFFED NSTRL (MISCELLANEOUS) ×4 IMPLANT
POUCH LAPAROSCOPIC INSTRUMENT (MISCELLANEOUS) ×1 IMPLANT
POUCH SPECIMEN RETRIEVAL 10MM (ENDOMECHANICALS) IMPLANT
SCISSORS LAP 5X35 DISP (ENDOMECHANICALS) IMPLANT
SEAL CERVICAL OMNI LOK (ABLATOR) ×3 IMPLANT
SEAL ROD LENS SCOPE MYOSURE (ABLATOR) ×4 IMPLANT
SET IRRIG TUBING LAPAROSCOPIC (IRRIGATION / IRRIGATOR) ×1 IMPLANT
SET TUBE SMOKE EVAC HIGH FLOW (TUBING) ×1 IMPLANT
SLEEVE ADV FIXATION 5X100MM (TROCAR) IMPLANT
SLEEVE SCD COMPRESS KNEE MED (MISCELLANEOUS) ×4 IMPLANT
SUT MON AB 4-0 PS1 27 (SUTURE) IMPLANT
SUT VIC AB 2-0 SH 27 (SUTURE)
SUT VIC AB 2-0 SH 27XBRD (SUTURE) IMPLANT
SUT VICRYL 0 UR6 27IN ABS (SUTURE) ×1 IMPLANT
SYR 10ML LL (SYRINGE) ×1 IMPLANT
SYSTEM TISS REMOVAL MYOSURE XL (MISCELLANEOUS) ×1 IMPLANT
TOWEL GREEN STERILE FF (TOWEL DISPOSABLE) ×8 IMPLANT
TRAY FOLEY W/BAG SLVR 14FR LF (SET/KITS/TRAYS/PACK) ×1 IMPLANT
TRENDGUARD 450 HYBRID PRO PACK (MISCELLANEOUS)
TRENDGUARD 600 HYBRID PROC PK (MISCELLANEOUS)
TROCAR ADV FIXATION 5X100MM (TROCAR) ×1 IMPLANT
TROCAR BALLN 12MMX100 BLUNT (TROCAR) ×1 IMPLANT
TROCAR VERSASTEP PLUS 5MM (TROCAR) IMPLANT
TROCAR XCEL NON-BLD 5MMX100MML (ENDOMECHANICALS) ×1 IMPLANT
UNDERPAD 30X30 (UNDERPADS AND DIAPERS) ×1 IMPLANT
WARMER LAPAROSCOPE (MISCELLANEOUS) ×1 IMPLANT

## 2019-01-31 NOTE — Discharge Instructions (Addendum)
 We will discuss your surgery once again in detail at your post-op visit in two to four weeks. If you haven't already done so, please call to make your appointment as soon as possible.  These instructions give you information on caring for yourself after your procedure. Your doctor may also give you more specific instructions. Call your doctor if you have any problems or questions after your procedure. HOME CARE Do not drive for 24 hours. Wait 1 week before doing any activities that wear you out. Do not stand for a long time. Limit stair climbing to once or twice a day. Rest often. Continue with your usual diet. Drink enough fluids to keep your pee (urine) clear or pale yellow. If you have a hard time pooping (constipation), you may: Take a medicine to help you go poop (laxative) as told by your doctor. Eat more fruit and bran. Drink more fluids. Take showers, not baths, for as long as told by your doctor. Do not swim or use a hot tub until your doctor says it is okay. Have someone with you for 1day after the procedure. Do not douche, use tampons, or have sex (intercourse) until seen by your doctor Only take medicines as told by your doctor. Do not take aspirin. It can cause bleeding. Keep all doctor visits. GET HELP IF: You have cramps or pain not helped by medicine. You have new pain in the belly (abdomen). You have a bad smelling fluid coming from your vagina. You have a rash. You have problems with any medicine. GET HELP RIGHT AWAY IF:  You start to bleed more than a regular period. You have a fever. You have chest pain. You have trouble breathing. You feel dizzy or feel like passing out (fainting). You pass out. You have pain in the tops of your shoulders. You have vaginal bleeding with or without clumps of blood (blood clots). MAKE SURE YOU: Understand these instructions. Will watch your condition. Will get help right away if you are not doing well or get  worse. Document Released: 05/25/2008 Document Revised: 08/21/2013 Document Reviewed: 03/15/2013 ExitCare Patient Information 2015 [employer]. This information is not intended to replace advice given to you by your health care provider. Make sure you discuss any questions you have with your health care provider.   Post Anesthesia Home Care Instructions  Activity: Get plenty of rest for the remainder of the day. A responsible individual must stay with you for 24 hours following the procedure.  For the next 24 hours, DO NOT: -Drive a car -Operate machinery -Drink alcoholic beverages -Take any medication unless instructed by your physician -Make any legal decisions or sign important papers.  Meals: Start with liquid foods such as gelatin or soup. Progress to regular foods as tolerated. Avoid greasy, spicy, heavy foods. If nausea and/or vomiting occur, drink only clear liquids until the nausea and/or vomiting subsides. Call your physician if vomiting continues.  Special Instructions/Symptoms: Your throat may feel dry or sore from the anesthesia or the breathing tube placed in your throat during surgery. If this causes discomfort, gargle with warm salt water. The discomfort should disappear within 24 hours.  If you had a scopolamine patch placed behind your ear for the management of post- operative nausea and/or vomiting:  1. The medication in the patch is effective for 72 hours, after which it should be removed.  Wrap patch in a tissue and discard in the trash. Wash hands thoroughly with soap and water. 2. You may   remove the patch earlier than 72 hours if you experience unpleasant side effects which may include dry mouth, dizziness or visual disturbances. 3. Avoid touching the patch. Wash your hands with soap and water after contact with the patch.     

## 2019-01-31 NOTE — Anesthesia Preprocedure Evaluation (Addendum)
Anesthesia Evaluation  Patient identified by MRN, date of birth, ID band Patient awake    Reviewed: Allergy & Precautions, NPO status , Patient's Chart, lab work & pertinent test results  Airway Mallampati: II  TM Distance: >3 FB Neck ROM: Full    Dental  (+) Chipped,    Pulmonary neg pulmonary ROS,    Pulmonary exam normal        Cardiovascular hypertension, Pt. on medications  Rhythm:Regular Rate:Normal     Neuro/Psych negative neurological ROS  negative psych ROS   GI/Hepatic negative GI ROS, Neg liver ROS,   Endo/Other  negative endocrine ROS  Renal/GU negative Renal ROS     Musculoskeletal negative musculoskeletal ROS (+)   Abdominal (+) + obese,   Peds  Hematology   Anesthesia Other Findings   Reproductive/Obstetrics                            Anesthesia Physical Anesthesia Plan  ASA: II  Anesthesia Plan: General   Post-op Pain Management:    Induction: Intravenous  PONV Risk Score and Plan: 4 or greater and Ondansetron, Dexamethasone, Midazolam and Scopolamine patch - Pre-op  Airway Management Planned: LMA  Additional Equipment: None  Intra-op Plan:   Post-operative Plan: Extubation in OR  Informed Consent: I have reviewed the patients History and Physical, chart, labs and discussed the procedure including the risks, benefits and alternatives for the proposed anesthesia with the patient or authorized representative who has indicated his/her understanding and acceptance.     Dental advisory given  Plan Discussed with: CRNA  Anesthesia Plan Comments: ( Pt COVID negative per [facility] )       Anesthesia Quick Evaluation

## 2019-01-31 NOTE — Anesthesia Postprocedure Evaluation (Signed)
Anesthesia Post Note  Patient: Jodi Mendez  Procedure(s) Performed: DILATATION & CURETTAGE/HYSTEROSCOPY WITH MYOSURE AND MYOMECTOMY (N/A Uterus)     Patient location during evaluation: PACU Anesthesia Type: General Level of consciousness: awake and alert Pain management: pain level controlled Vital Signs Assessment: post-procedure vital signs reviewed and stable Respiratory status: spontaneous breathing, nonlabored ventilation, respiratory function stable and patient connected to nasal cannula oxygen Cardiovascular status: blood pressure returned to baseline and stable Postop Assessment: no apparent nausea or vomiting Anesthetic complications: no    Last Vitals:  Vitals:   01/31/19 1216 01/31/19 1230  BP:    Pulse:  75  Resp: (!) 21 15  Temp:    SpO2:  100%    Last Pain:  Vitals:   01/31/19 1230  TempSrc:   PainSc: [place] Ezzie Senat

## 2019-01-31 NOTE — Anesthesia Procedure Notes (Signed)
Procedure Name: LMA Insertion Date/Time: 01/31/2019 11:07 AM Performed by: Genelle Bal, CRNA Pre-anesthesia Checklist: Patient identified, Emergency Drugs available, Suction available and Patient being monitored Patient Re-evaluated:Patient Re-evaluated prior to induction Oxygen Delivery Method: Circle system utilized Preoxygenation: Pre-oxygenation with 100% oxygen Induction Type: IV induction Ventilation: Mask ventilation without difficulty LMA: LMA inserted LMA Size: 4.0 Number of attempts: 1 Airway Equipment and Method: Bite block Placement Confirmation: positive ETCO2 Tube secured with: Tape Dental Injury: Teeth and Oropharynx as per pre-operative assessment

## 2019-01-31 NOTE — Telephone Encounter (Signed)
Patient called in a stated that she had surgery this morning with Dr.Pickens and went to the pharmacy [facility]) and they closed and made everyone leave. She has already given them the Rx. Advised pt to take tylenol and to call in the morning to see if they are open and if not to give the office a call back because Dr.Pickens stated he will send it some where else.

## 2019-01-31 NOTE — Op Note (Addendum)
Operative Note   01/31/2019  PRE-OP DIAGNOSIS: History of anemia. Large submucosal fibroid. Desire to preserve fertility   POST-OP DIAGNOSIS: Same  SURGEON: Surgeon(s) and Role:    * Aletha Halim, MD - Primary  ASSISTANT: None  PROCEDURE:  Hysteroscopy, Myosure myomectomy (partial)  ANESTHESIA: General and paracervical block  ESTIMATED BLOOD LOSS: 38mL  DRAINS: per anesthesia note  TOTAL IV FLUIDS: per anesthesia note  SPECIMENS: fibroid  FLUID DEFICIT: 2058mL  VTE PROPHYLAXIS: SCDs to the bilateral lower extremities  ANTIBIOTICS: not indicated  DISPOSITION: PACU - hemodynamically stable.  CONDITION: stable  COMPLICATIONS: poor visualization leading to incomplete resection and a rising fluid deficit.   FINDINGS: Exam under anesthesia revealed 8 week sized uterus with no masses and bilateral adnexa without masses or fullness. On hysteroscopy, there was poor visualization due to blood but the endometrial cavity appeared atrophic; bleeding appeared to be coming from the fibroid itself; vasopressin unable to be used due to her HTN. Tubal ostia on right seen. Patient sounded to 8cm.  4-5cm submucosal fibroid seen on the left lateral aspect a few cm above the internal os. Removal was going well with the large myosure blade but I was already at a deficit of approximately 1760mL before I could start. A little more than half of it was removed.   PROCEDURE IN DETAIL:  After informed consent was obtained, the patient was taken to the operating room where anesthesia was obtained without difficulty. The patient was positioned in the dorsal lithotomy position in [place]. The patient was examined under anesthesia, with the above noted findings.  The bi-valved speculum was placed inside the patient's vagina, and the the anterior lip of the cervix was seen and grasped with the tenaculum.  A paracervical block was achieved with 73mL of 1% lidocaine and then the cervix was progressively  dilated to a 17 French-Pratt dilator.  The hysteroscope was introduced with the above noted findings; procedure stopped due to fluid deficit. Excellent hemostasis was noted, and all instruments were removed.  She was then taken out of dorsal lithotomy. The patient tolerated the procedure well.  Sponge, lap and instrument counts were correct x2.  The patient was taken to recovery room in excellent condition.  If bleeding not improved now off the megace and s/p surgery, options will be try again with another hysterscopy or a hysterectomy (preferred)  Durene Romans MD Attending Center for [place])

## 2019-01-31 NOTE — Transfer of Care (Signed)
Immediate Anesthesia Transfer of Care Note  Patient: Jodi Mendez  Procedure(s) Performed: DILATATION & CURETTAGE/HYSTEROSCOPY WITH MYOSURE AND MYOMECTOMY (N/A Uterus) LAPAROSCOPY DIAGNOSTIC (N/A )  Patient Location: PACU  Anesthesia Type:General  Level of Consciousness: awake, alert  and oriented  Airway & Oxygen Therapy: Patient Spontanous Breathing and Patient connected to face mask oxygen  Post-op Assessment: Report given to RN and Post -op Vital signs reviewed and stable  Post vital signs: Reviewed and stable  Last Vitals:  Vitals Value Taken Time  BP 146/98 01/31/2019 12:00 PM  Temp    Pulse    Resp 0 01/31/2019 12:01 PM  SpO2    Vitals shown include unvalidated device data.  Last Pain:  Vitals:   01/31/19 0942  TempSrc: Oral  PainSc: 0-No pain      Patients Stated Pain Goal: 1 (84/13/24 4010)  Complications: No apparent anesthesia complications

## 2019-01-31 NOTE — H&P (Signed)
Obstetrics and Gynecology Pre Op H&P  Appointment Date: 01/18/2019  OBGYN Clinic: Center for [facility]  Primary Care Provider: Scot Jun  Chief Complaint: pre op H&P  History of Present Illness: Jodi Mendez is a 43 y.o. African-American G1P0010 (No LMP recorded. (Menstrual status: Irregular Periods).), seen for the above chief complaint. Her past medical history is significant for HTN, fibroids anemia   Patient admitted in the past with significant anemia and 4-5cm submucosal fibroid seen on u/s. Options d/w her and pt would like to do uterine sparing surgery and do hysteroscopy, myosure.   No current bleeding.   Review of Systems:  as noted in the History of Present Illness.      Patient Active Problem List   Diagnosis Date Noted  . Hypertension 11/06/2018  . Complex cyst of left ovary 10/06/2018  . Chronic anemia 10/05/2018  . Menorrhagia 02/09/2013  . Leiomyoma of uterus 02/09/2013     Past Medical History:      Past Medical History:  Diagnosis Date  . Anemia   . Hypertension 11/06/2018  . Transient hypertension     Past Surgical History:       Past Surgical History:  Procedure Laterality Date  . UNILATERAL SALPINGECTOMY Left 2007   Ectopic pregnancy. suprapubic mini-lap    Past Obstetrical History:  OB History  Gravida Para Term Preterm AB Living  1 0     1    SAB TAB Ectopic Multiple Live Births         1           # Outcome Date GA Lbr Len/2nd Weight Sex Delivery Anes PTL Lv  1 Ectopic            Past Gynecological History: As per HPI. History of Pap Smear(s): Yes.   Last pap 2017, which was negative and hpv negative  Social History:  Social History        Socioeconomic History  . Marital status: Divorced    Spouse name: Not on file  . Number of children: 0  . Years of education: Not on file  . Highest education level: Some college, no degree  Occupational History  . Not on file   Social Needs  . Financial resource strain: Not on file  . Food insecurity:    Worry: Not on file    Inability: Not on file  . Transportation needs:    Medical: Yes    Non-medical: Yes  Tobacco Use  . Smoking status: Never Smoker  . Smokeless tobacco: Never Used  Substance and Sexual Activity  . Alcohol use: Yes    Comment: social  . Drug use: No  . Sexual activity: Not Currently    Comment: not within the last couple of months/sexual active with women  Lifestyle  . Physical activity:    Days per week: Not on file    Minutes per session: Not on file  . Stress: Not on file  Relationships  . Social connections:    Talks on phone: Not on file    Gets together: Not on file    Attends religious service: Not on file    Active member of club or organization: Not on file    Attends meetings of clubs or organizations: Not on file    Relationship status: Not on file  . Intimate partner violence:    Fear of current or ex partner: Not on file    Emotionally abused: Not on file  Physically abused: Not on file    Forced sexual activity: Not on file  Other Topics Concern  . Not on file  Social History Narrative  . Not on file    Family History:       Family History  Problem Relation Age of Onset  . Diabetes Mother   . Hypertension Mother     Medications Current Facility-Administered Medications  Medication Dose Route Frequency Provider Last Rate Last Dose  . fentaNYL (SUBLIMAZE) injection 50-100 mcg  50-100 mcg Intravenous PRN Josephine Igo, MD      . lactated ringers infusion   Intravenous Continuous Josephine Igo, MD 10 mL/hr at 01/31/19 [phone]    . lactated ringers infusion   Intravenous Continuous Aletha Halim, MD      . midazolam (VERSED) injection 1-2 mg  1-2 mg Intravenous PRN Josephine Igo, MD      . scopolamine (TRANSDERM-SCOP) 1 MG/3DAYS 1.5 mg  1 patch Transdermal Once PRN Josephine Igo, MD         Allergies   Current Vital Signs 24h Vital Sign Ranges  T 98.8 F (37.1 C) Temp  Avg: 98.8 F (37.1 C)  Min: 98.8 F (37.1 C)  Max: 98.8 F (37.1 C)  BP (!) 145/96 BP  Min: 145/96  Max: 145/96  HR 73 Pulse  Avg: 73  Min: 73  Max: 73  RR 19 Resp  Avg: 19  Min: 19  Max: 19  SaO2 100 % Room Air SpO2  Avg: 100 %  Min: 100 %  Max: 100 %       24 Hour I/O Current Shift I/O  Time Ins Outs No intake/output data recorded. No intake/output data recorded.   NAD CTAB Normal s1 and s2, no MRGs  Laboratory:  UPT negative CBC    Component Value Date/Time   WBC 3.2 (L) 01/17/2019 1045   WBC 5.7 10/06/2018 0534   RBC 5.36 (H) 01/17/2019 1045   RBC 3.33 (L) 10/06/2018 0534   HGB 14.3 01/17/2019 1045   HCT 44.7 01/17/2019 1045   PLT 263 01/17/2019 1045   MCV 83 01/17/2019 1045   MCH 26.7 01/17/2019 1045   MCH 24.6 (L) 10/06/2018 0534   MCHC 32.0 01/17/2019 1045   MCHC 31.4 10/06/2018 0534   RDW 17.6 (H) 01/17/2019 1045   LYMPHSABS 1.6 01/17/2019 1045   MONOABS 0.5 10/05/2018 0608   EOSABS 0.0 01/17/2019 1045   BASOSABS 0.0 01/17/2019 1045     Radiology:  CLINICAL DATA: Follow-up complex LEFT ovarian cyst  EXAM: ULTRASOUND PELVIS TRANSVAGINAL  TECHNIQUE: Transvaginal ultrasound examination of the pelvis was performed including evaluation of the uterus, ovaries, adnexal regions, and pelvic cul-de-sac.  COMPARISON: 10/05/2018  FINDINGS: Uterus  Measurements: 13.5 x 8.1 x 7.6 cm = volume: 434 mL. Anteverted. Heterogeneous echogenicity. Large central mass within the uterus 4.5 x 4.0 x 4.1 cm compatible with leiomyoma. This extend submucosal and distorts the endometrial complex. Probable additional small uterine leiomyomata.  Endometrium  Thickness: 13 mm. Poorly visualized due to distortion by large central uterine mass period  Right ovary  Measurements: 3.2 x 1.5 x 1.5 cm = volume: 3.7 mL. Normal morphology without mass  Left  ovary  Measurements: 3.2 x 2.1 x 2.2 cm = volume: 7.7 mL. Hypoechoic nodule 1.9 x 1.2 x 1.2 cm containing scattered internal echogenicity question small hemorrhagic cyst; this has decreased in size since the prior exam when it measured 3.6 x 3.1 x 2.2 cm. No additional masses.  Other findings:  Trace free pelvic fluid.  IMPRESSION: Enlarged uterus containing leiomyomata including a 4.5 cm diameter central submucosal leiomyoma.  Interval decrease in size of hypoechoic complicated cyst within LEFT ovary likely representing an evolving hemorrhagic cyst.  No new intrapelvic abnormalities.   Electronically Signed By: Lavonia Dana M.D. On: 10/19/2018 13:16  Assessment: pt doing well  Plan:  D/w pt re: recommend hysteroscopy, myosure myomectmy and pt amenable to plan. Can proceed when OR is ready  Durene Romans MD Attending Center for [facility] (Faculty Practice) 01/31/2019 Time: 1045am

## 2019-02-02 ENCOUNTER — Encounter (HOSPITAL_BASED_OUTPATIENT_CLINIC_OR_DEPARTMENT_OTHER): Payer: Self-pay | Admitting: Obstetrics and Gynecology

## 2019-02-28 ENCOUNTER — Telehealth: Payer: Self-pay | Admitting: Obstetrics & Gynecology

## 2019-02-28 NOTE — Telephone Encounter (Signed)
Called the patient to complete the pre-screen. The patient answered no to COVID19 symptoms and/or being previously diagnosed. Informed the patient of the wearing a face mask, sanitizing hands at the sanitizing station upon entering our office, and no visitors or children are allowed due to the COVID19 restrictions. The patient verbalized understanding. °

## 2019-03-01 ENCOUNTER — Ambulatory Visit (INDEPENDENT_AMBULATORY_CARE_PROVIDER_SITE_OTHER): Payer: Self-pay | Admitting: Obstetrics and Gynecology

## 2019-03-01 ENCOUNTER — Other Ambulatory Visit: Payer: Self-pay

## 2019-03-01 VITALS — BP 151/95 | HR 73 | Temp 98.3°F | Ht 63.0 in | Wt 194.0 lb

## 2019-03-01 DIAGNOSIS — Z9889 Other specified postprocedural states: Secondary | ICD-10-CM

## 2019-03-01 DIAGNOSIS — Z09 Encounter for follow-up examination after completed treatment for conditions other than malignant neoplasm: Secondary | ICD-10-CM

## 2019-03-01 NOTE — Progress Notes (Signed)
Obstetrics and Gynecology Visit Return Patient Evaluation  Appointment Date: 03/01/2019  Primary Care Provider: [place], [place] for [facility]  Chief Complaint: scheduled post op check  History of Present Illness:  Jodi Mendez is a 43 y.o. 6/3 hysteroscopy, myosure myomectomy (partial); pt was discharged from the pacu.   Patient states she had bleeding for about two weeks and stopped about a week or so ago.    Review of Systems:as noted in the History of Present Illness.  Patient Active Problem List   Diagnosis Date Noted  . Hypertension 11/06/2018  . Complex cyst of left ovary 10/06/2018  . Chronic anemia 10/05/2018  . Menorrhagia 02/09/2013  . Leiomyoma of uterus 02/09/2013   Medications:  [name] had no medications administered during this visit. Current Outpatient Medications  Medication Sig Dispense Refill  . Multiple Vitamin (MULTIVITAMIN WITH MINERALS) TABS Take 1 tablet by mouth daily.     Marland Kitchen senna (SENNA-LAX) 8.6 MG tablet Take 1 tablet by mouth daily.    Marland Kitchen amLODipine (NORVASC) 5 MG tablet Take 2 tablets (10 mg total) by mouth daily. (Patient not taking: Reported on 03/01/2019) 60 tablet 2  . ferrous gluconate (FERGON) 324 MG tablet Take 1 tablet (324 mg total) by mouth 2 (two) times daily with a meal. (Patient not taking: Reported on 03/01/2019) 60 tablet 1   No current facility-administered medications for this visit.     Allergies: is allergic to sulfa antibiotics.  Physical Exam:  BP (!) 151/95   Pulse 73   Temp 98.3 F (36.8 C) (Oral)   Ht 5\' 3"  (1.6 m)   Wt 194 lb (88 kg)   BMI 34.37 kg/m  Body mass index is 34.37 kg/m. General appearance: Well nourished, well developed female in no acute distress.  Neuro/Psych:  Normal mood and affect.    Assessment: pt doing well  Plan: D/w her that I only got about half the submucosal fibroid. I told her I'd like to see her in about two months to see how her  bleeding is with the goal being to have 3-7 days of bleeding qmonth, regular, not particularly heavy or painful. If bleeding comes back heavy, like how it was, pt to start the megace again and let me know. I told her at that point options being to go back for a second resection or hysterectomy.   Patient stopped all meds after surgery including her HTN med. I told her okay to restart.   RTC: 2-108m  Durene Romans MD Attending Center for [facility])

## 2019-03-30 ENCOUNTER — Telehealth: Payer: Self-pay | Admitting: Obstetrics and Gynecology

## 2019-03-30 ENCOUNTER — Other Ambulatory Visit: Payer: Self-pay | Admitting: Obstetrics and Gynecology

## 2019-03-30 MED ORDER — MEGESTROL ACETATE 40 MG PO TABS: 80.0000 mg | ORAL_TABLET | Freq: Two times a day (BID) | ORAL | 0 refills | Status: DC

## 2019-03-30 NOTE — Telephone Encounter (Signed)
Attempted to contact patient with her next appointment with Pickens ( 8/19 @ 3:15). No answer and could not leave a voicemail because the mailbox was full. Appointment reminder mailed.

## 2019-04-17 ENCOUNTER — Telehealth: Payer: Self-pay | Admitting: Obstetrics and Gynecology

## 2019-04-17 NOTE — Telephone Encounter (Signed)
Called patient but was unavailable to remind her of her appointment on 04-18-2019.

## 2019-04-18 ENCOUNTER — Telehealth (INDEPENDENT_AMBULATORY_CARE_PROVIDER_SITE_OTHER): Payer: Self-pay | Admitting: Obstetrics and Gynecology

## 2019-04-18 ENCOUNTER — Other Ambulatory Visit: Payer: Self-pay

## 2019-04-18 DIAGNOSIS — N939 Abnormal uterine and vaginal bleeding, unspecified: Secondary | ICD-10-CM

## 2019-04-18 DIAGNOSIS — D250 Submucous leiomyoma of uterus: Secondary | ICD-10-CM

## 2019-04-18 NOTE — Progress Notes (Signed)
   TELEHEALTH VIRTUAL GYNECOLOGY VISIT ENCOUNTER NOTE  I connected with [name] on 04/18/19 at  3:15 PM EDT by telephone at home and verified that I am speaking with the correct person using two identifiers.   I discussed the limitations, risks, security and privacy concerns of performing an evaluation and management service by telephone and the availability of in person appointments. I also discussed with the patient that there may be a patient responsible charge related to this service. The patient expressed understanding and agreed to proceed.  Chief Complaint: AUB follow up  History:  Jodi Mendez is a 43 y.o. G1P0010 being evaluated today for continued AUB.   She had a 6/3 hysteroscopy and partial removal of submucosal fibroid (unable to complete due to fluid deficit); prior to this she had a hospitalization for hgb of 2.7 and need for blood transfusion.  AUB came back in July and she tried megace again but it stopped working (tried it for a week) and so went back to provera (takes it 1 or 2x/day) on her own in late july; now she is only spotting.   Patient in a same sex relationship     Past Medical History:  Diagnosis Date  . Anemia   . Hypertension 11/06/2018  . Transient hypertension    Past Surgical History:  Procedure Laterality Date  . DILATATION & CURETTAGE/HYSTEROSCOPY WITH MYOSURE N/A 01/31/2019   Procedure: DILATATION & CURETTAGE/HYSTEROSCOPY WITH MYOSURE AND MYOMECTOMY;  Surgeon: Aletha Halim, MD;  Location: [place];  Service: Gynecology;  Laterality: N/A;  . UNILATERAL SALPINGECTOMY Left 2007   Ectopic pregnancy. suprapubic mini-lap   The following portions of the patient's history were reviewed and updated as appropriate: allergies, current medications, past family history, past medical history, past social history, past surgical history and problem list.    Review of Systems:  Pertinent items noted in HPI and remainder of  comprehensive ROS otherwise negative.  Physical Exam:   General:  Alert, oriented and cooperative.   Mental Status: Normal mood and affect perceived. Normal judgment and thought content.  Physical exam deferred due to nature of the encounter  Labs and Imaging No results found for this or any previous visit (from the past 336 hour(s)). No results found.    Assessment and Plan:  Patient no longer interested in fertility. I told her that I still recommend hysterectomy given her history, which she is amenable to. I told her I'd try in laparoscopically, given her P0 status and h/o open surgery, which she is amenable to. Will send request for TLH/BS/cysto. Pt to stay on provera and I told her to do bid until surgery.    I discussed the assessment and treatment plan with the patient. The patient was provided an opportunity to ask questions and all were answered. The patient agreed with the plan and demonstrated an understanding of the instructions.   The patient was advised to call back or seek an in-person evaluation/go to the ED if the symptoms worsen or if the condition fails to improve as anticipated.  I provided 15 minutes of non-face-to-face time during this encounter. The visit was done via a MyChart visit.    Aletha Halim, MD Center for [place]

## 2019-04-18 NOTE — Progress Notes (Signed)
I connected with  Jodi Mendez on 04/18/19 at  3:15 PM EDT by telephone and verified that I am speaking with the correct person using two identifiers.   I discussed the limitations, risks, security and privacy concerns of performing an evaluation and management service by telephone and the availability of in person appointments. I also discussed with the patient that there may be a patient responsible charge related to this service. The patient expressed understanding and agreed to proceed.  Aviva Signs Jodi Mendez, CMA 04/18/2019  3:21 PM   Still spotting, has not had a "non bleeding" Day since 03/29/2019 Questions about intercourse, but  Not with penetration.

## 2019-04-28 ENCOUNTER — Other Ambulatory Visit: Payer: Self-pay | Admitting: Obstetrics and Gynecology

## 2019-05-03 ENCOUNTER — Encounter: Payer: Self-pay | Admitting: Family Medicine

## 2019-05-03 MED ORDER — AMLODIPINE BESYLATE 5 MG PO TABS: 10.0000 mg | ORAL_TABLET | Freq: Every day | ORAL | 0 refills | Status: DC

## 2019-05-08 DIAGNOSIS — Z029 Encounter for administrative examinations, unspecified: Secondary | ICD-10-CM

## 2019-05-09 ENCOUNTER — Ambulatory Visit: Payer: Self-pay | Admitting: Pharmacist

## 2019-05-09 ENCOUNTER — Encounter: Payer: Self-pay | Admitting: Pharmacist

## 2019-05-09 ENCOUNTER — Ambulatory Visit: Payer: Self-pay | Attending: Family Medicine | Admitting: Pharmacist

## 2019-05-09 ENCOUNTER — Other Ambulatory Visit: Payer: Self-pay

## 2019-05-09 VITALS — BP 126/73 | HR 78

## 2019-05-09 DIAGNOSIS — I10 Essential (primary) hypertension: Secondary | ICD-10-CM

## 2019-05-09 MED ORDER — AMLODIPINE BESYLATE 5 MG PO TABS: 10.0000 mg | ORAL_TABLET | Freq: Every day | ORAL | 2 refills | Status: AC

## 2019-05-09 NOTE — Patient Instructions (Signed)
Thank you for coming to see [place] today.   Blood pressure today is well-controlled. Great job; remember to take your amlodipine every day!  Limiting salt and caffeine, as well as exercising as able for at least 30 minutes for 5 days out of the week, can also help you lower your blood pressure.  Take your blood pressure at home if you are able. Please write down these numbers and bring them to your visits.  If you have any questions about medications, please call me [phone].  Lurena Joiner

## 2019-05-09 NOTE — Progress Notes (Signed)
   S:    PCP: Lavell Anchors   Patient arrives in good spirits. Presents to the clinic for hypertension evaluation, counseling, and management.  Patient was referred and last seen by Primary Care Provider on 01/25/2019.   Patient reports adherence with medications but deviates occasionally from taking it on a consistent schedule.   Current BP Medications include:  Amlodipine 10 mg daily (takes two, 5mg  tablets; restarted last Thurs (05/03/19)  Dietary habits include: admits to drinking energy drinks occasionally; compliant with salt restriction (uses Ms. Dash, garlic powder) Exercise habits include: none outside of work (does walk about 0.5 miles to her work [employer]) Family / Social history:  - FHx: DM, HTN (mother) - Tobacco: never smoker  - Alcohol: occasional   O:  L arm after 5 minutes rest: 126/73, HR 78  Home BP readings: does not check  Last 3 Office BP readings: BP Readings from Last 3 Encounters:  05/09/19 126/73  03/01/19 (!) 151/95  01/31/19 (!) 145/90   BMET    Component Value Date/Time   NA 138 01/17/2019 1045   K 4.2 01/17/2019 1045   CL 100 01/17/2019 1045   CO2 24 01/17/2019 1045   GLUCOSE 71 01/17/2019 1045   GLUCOSE 84 10/06/2018 0534   BUN 7 01/17/2019 1045   CREATININE 0.77 01/17/2019 1045   CALCIUM 9.4 01/17/2019 1045   GFRNONAA 96 01/17/2019 1045   GFRAA 110 01/17/2019 1045   Renal function: CrCl cannot be calculated (Patient's most recent lab result is older than the maximum 21 days allowed.).  Clinical ASCVD: No  The 10-year ASCVD risk score Mikey Bussing DC Jr., et al., 2013) is: 0.9%   Values used to calculate the score:     Age: 44 years     Sex: Female     Is Non-Hispanic African American: Yes     Diabetic: No     Tobacco smoker: No     Systolic Blood Pressure: 123XX123 mmHg     Is BP treated: Yes     HDL Cholesterol: 74 mg/dL     Total Cholesterol: 193 mg/dL  A/P: Hypertension longstanding currently controlled on current medications. BP Goal =  <130/80 mmHg. Patient is adherent with current medications.  -Continued current regimen. -Reinforced compliance and timing.  -Counseled on lifestyle modifications for blood pressure control including reduced dietary sodium, increased exercise, adequate sleep -HM: Fluarix recommended; pt wishes to "think about it"  Results reviewed and written information provided. Total time in face-to-face counseling 15 minutes.   F/U Clinic Visit w/ PCP.    Patient seen with: Dillard Essex PharmD Candidate  Class of 2022 [place], PharmD, [place] [phone]

## 2019-05-15 ENCOUNTER — Other Ambulatory Visit: Payer: Self-pay | Admitting: Obstetrics and Gynecology

## 2019-05-17 ENCOUNTER — Telehealth: Payer: Self-pay

## 2019-05-17 NOTE — Telephone Encounter (Signed)
-----   Message from Francia Greaves sent at 05/17/2019 10:46 AM EDT ----- Regarding: Needs Hysterectomy Statement Surgery 10/06, needs hysterectomy statement ASAP

## 2019-05-17 NOTE — Telephone Encounter (Signed)
Called pt to advise to come to office asap to sign Hysterectomy Statement Form before surgery, no answer, left VM.

## 2019-05-18 ENCOUNTER — Ambulatory Visit: Payer: Self-pay

## 2019-05-24 ENCOUNTER — Other Ambulatory Visit: Payer: Self-pay | Admitting: Obstetrics and Gynecology

## 2019-05-29 NOTE — Progress Notes (Signed)
[place] ([address] [place] [id number] [place] [address] Phone: [phone] Fax: [phone]      Your procedure is scheduled on Tuesday, October 6th.  Report to [facility] Main Entrance "A" at 8:00 A.M., and check in at the Admitting office.  Call this number if you have problems the morning of surgery:  [phone]  Call [phone] if you have any questions prior to your surgery date Monday-Friday 8am-4pm    Remember:  Do not eat after midnight the night before your surgery  You may drink clear liquids until 7:00 AM the morning of your surgery.   Clear liquids allowed are: Water, Non-Citrus Juices (without pulp), Carbonated Beverages, Clear Tea, Black Coffee Only, and Gatorade    Take these medicines the morning of surgery with A SIP OF WATER   Amlodipine (Norvasc)    7 days prior to surgery STOP taking any Aspirin (unless otherwise instructed by your surgeon), Aleve, Naproxen, Ibuprofen, Motrin, Advil, Goody's, BC's, all herbal medications, fish oil, and all vitamins.    The Morning of Surgery  Do not wear jewelry, make-up or nail polish.  Do not wear lotions, powders, or perfumes, or deodorant  Do not shave 48 hours prior to surgery.    Do not bring valuables to the hospital.  [facility] is not responsible for any belongings or valuables.  If you are a smoker, DO NOT Smoke 24 hours prior to surgery IF you wear a CPAP at night please bring your mask, tubing, and machine the morning of surgery   Remember that you must have someone to transport you home after your surgery, and remain with you for 24 hours if you are discharged the same day.   Contacts, glasses, hearing aids, dentures or bridgework may not be worn into surgery.    Leave your suitcase in the car.  After surgery it may be brought to your room.  For patients admitted to the hospital, discharge time will be determined by your treatment  team.  Patients discharged the day of surgery will not be allowed to drive home.    Special instructions:   [place]- Preparing For Surgery  Before surgery, you can play an important role. Because skin is not sterile, your skin needs to be as free of germs as possible. You can reduce the number of germs on your skin by washing with CHG (chlorahexidine gluconate) Soap before surgery.  CHG is an antiseptic cleaner which kills germs and bonds with the skin to continue killing germs even after washing.    Oral Hygiene is also important to reduce your risk of infection.  Remember - BRUSH YOUR TEETH THE MORNING OF SURGERY WITH YOUR REGULAR TOOTHPASTE  Please do not use if you have an allergy to CHG or antibacterial soaps. If your skin becomes reddened/irritated stop using the CHG.  Do not shave (including legs and underarms) for at least 48 hours prior to first CHG shower. It is OK to shave your face.  Please follow these instructions carefully.   1. Shower the NIGHT BEFORE SURGERY and the MORNING OF SURGERY with CHG Soap.   2. If you chose to wash your hair, wash your hair first as usual with your normal shampoo.  3. After you shampoo, rinse your hair and body thoroughly to remove the shampoo.  4. Use CHG as you would any other liquid soap. You can apply CHG directly to the skin and wash  gently with a scrungie or a clean washcloth.   5. Apply the CHG Soap to your body ONLY FROM THE NECK DOWN.  Do not use on open wounds or open sores. Avoid contact with your eyes, ears, mouth and genitals (private parts). Wash Face and genitals (private parts)  with your normal soap.   6. Wash thoroughly, paying special attention to the area where your surgery will be performed.  7. Thoroughly rinse your body with warm water from the neck down.  8. DO NOT shower/wash with your normal soap after using and rinsing off the CHG Soap.  9. Pat yourself dry with a CLEAN TOWEL.  10. Wear CLEAN PAJAMAS to bed  the night before surgery, wear comfortable clothes the morning of surgery  11. Place CLEAN SHEETS on your bed the night of your first shower and DO NOT SLEEP WITH PETS.    Day of Surgery:  Do not apply any deodorants/lotions. Please shower the morning of surgery with the CHG soap  Please wear clean clothes to the hospital/surgery center.   Remember to brush your teeth WITH YOUR REGULAR TOOTHPASTE.   Please read over the following fact sheets that you were given.

## 2019-05-30 ENCOUNTER — Telehealth: Payer: Self-pay | Admitting: Obstetrics and Gynecology

## 2019-05-30 ENCOUNTER — Other Ambulatory Visit: Payer: Self-pay

## 2019-05-30 ENCOUNTER — Other Ambulatory Visit: Payer: Self-pay | Admitting: Obstetrics and Gynecology

## 2019-05-30 ENCOUNTER — Encounter (HOSPITAL_COMMUNITY): Payer: Self-pay

## 2019-05-30 ENCOUNTER — Observation Stay (HOSPITAL_COMMUNITY)
Admission: AD | Admit: 2019-05-30 | Discharge: 2019-05-31 | Disposition: A | Discharge status: Home / Self Care | Payer: Self-pay | Source: Ambulatory Visit | Attending: Obstetrics and Gynecology | Admitting: Obstetrics and Gynecology

## 2019-05-30 ENCOUNTER — Encounter (HOSPITAL_COMMUNITY)
Admission: RE | Admit: 2019-05-30 | Discharge: 2019-05-30 | Disposition: A | Discharge status: Home / Self Care | Payer: Self-pay | Source: Ambulatory Visit | Attending: Obstetrics and Gynecology | Admitting: Obstetrics and Gynecology

## 2019-05-30 DIAGNOSIS — Z8249 Family history of ischemic heart disease and other diseases of the circulatory system: Secondary | ICD-10-CM | POA: Insufficient documentation

## 2019-05-30 DIAGNOSIS — D649 Anemia, unspecified: Secondary | ICD-10-CM | POA: Insufficient documentation

## 2019-05-30 DIAGNOSIS — Z79899 Other long term (current) drug therapy: Secondary | ICD-10-CM | POA: Insufficient documentation

## 2019-05-30 DIAGNOSIS — I10 Essential (primary) hypertension: Secondary | ICD-10-CM | POA: Insufficient documentation

## 2019-05-30 DIAGNOSIS — D282 Benign neoplasm of uterine tubes and ligaments: Secondary | ICD-10-CM | POA: Insufficient documentation

## 2019-05-30 DIAGNOSIS — Z0184 Encounter for antibody response examination: Secondary | ICD-10-CM | POA: Insufficient documentation

## 2019-05-30 DIAGNOSIS — N920 Excessive and frequent menstruation with regular cycle: Secondary | ICD-10-CM | POA: Diagnosis present

## 2019-05-30 DIAGNOSIS — Z20828 Contact with and (suspected) exposure to other viral communicable diseases: Secondary | ICD-10-CM | POA: Insufficient documentation

## 2019-05-30 DIAGNOSIS — Z01812 Encounter for preprocedural laboratory examination: Secondary | ICD-10-CM | POA: Insufficient documentation

## 2019-05-30 DIAGNOSIS — D219 Benign neoplasm of connective and other soft tissue, unspecified: Secondary | ICD-10-CM | POA: Insufficient documentation

## 2019-05-30 DIAGNOSIS — Z882 Allergy status to sulfonamides status: Secondary | ICD-10-CM | POA: Insufficient documentation

## 2019-05-30 LAB — COMPREHENSIVE METABOLIC PANEL WITH GFR
ALT: 16 U/L (ref 0–44)
AST: 20 U/L (ref 15–41)
Albumin: 3.9 g/dL (ref 3.5–5.0)
Alkaline Phosphatase: 79 U/L (ref 38–126)
Anion gap: 8 (ref 5–15)
BUN: 8 mg/dL (ref 6–20)
CO2: 22 mmol/L (ref 22–32)
Calcium: 8.9 mg/dL (ref 8.9–10.3)
Chloride: 107 mmol/L (ref 98–111)
Creatinine, Ser: 0.65 mg/dL (ref 0.44–1.00)
GFR calc Af Amer: >60 mL/min
GFR calc non Af Amer: >60 mL/min
Glucose, Bld: 110 mg/dL — ABNORMAL HIGH (ref 70–99)
Potassium: 3.6 mmol/L (ref 3.5–5.1)
Sodium: 137 mmol/L (ref 135–145)
Total Bilirubin: 0.6 mg/dL (ref 0.3–1.2)
Total Protein: 8.1 g/dL (ref 6.5–8.1)

## 2019-05-30 LAB — TYPE AND SCREEN
ABO/RH(D): O POS
Antibody Screen: NEGATIVE

## 2019-05-30 LAB — CBC
HCT: 21.1 % — ABNORMAL LOW (ref 36.0–46.0)
HCT: 23.1 % — ABNORMAL LOW (ref 36.0–46.0)
Hemoglobin: 5.5 g/dL — CL (ref 12.0–15.0)
Hemoglobin: 5.9 g/dL — CL (ref 12.0–15.0)
MCH: 16.6 pg — ABNORMAL LOW (ref 26.0–34.0)
MCH: 16.2 pg — ABNORMAL LOW (ref 26.0–34.0)
MCHC: 26.1 g/dL — ABNORMAL LOW (ref 30.0–36.0)
MCHC: 25.5 g/dL — ABNORMAL LOW (ref 30.0–36.0)
MCV: 63.7 fL — ABNORMAL LOW (ref 80.0–100.0)
MCV: 63.5 fL — ABNORMAL LOW (ref 80.0–100.0)
Platelets: 383 K/uL (ref 150–400)
Platelets: 362 10*3/uL (ref 150–400)
RBC: 3.31 MIL/uL — ABNORMAL LOW (ref 3.87–5.11)
RBC: 3.64 MIL/uL — ABNORMAL LOW (ref 3.87–5.11)
RDW: 24.6 % — ABNORMAL HIGH (ref 11.5–15.5)
RDW: 24.8 % — ABNORMAL HIGH (ref 11.5–15.5)
WBC: 3.7 K/uL — ABNORMAL LOW (ref 4.0–10.5)
WBC: 3.8 10*3/uL — ABNORMAL LOW (ref 4.0–10.5)
nRBC: 0 % (ref 0.0–0.2)
nRBC: 0.5 % — ABNORMAL HIGH (ref 0.0–0.2)

## 2019-05-30 LAB — PREPARE RBC (CROSSMATCH)

## 2019-05-30 LAB — SARS CORONAVIRUS 2 BY RT PCR (HOSPITAL ORDER, PERFORMED IN ~~LOC~~ HOSPITAL LAB): SARS Coronavirus 2: NEGATIVE

## 2019-05-30 MED ORDER — POLYETHYLENE GLYCOL 3350 17 G PO PACK: 17.0000 g | PACK | Freq: Every day | ORAL | 0 refills | Status: DC

## 2019-05-30 MED ORDER — ZOLPIDEM TARTRATE 5 MG PO TABS: 5.0000 mg | ORAL_TABLET | Freq: Every evening | ORAL | Status: DC | PRN

## 2019-05-30 MED ORDER — ACETAMINOPHEN 325 MG PO TABS
650.0000 mg | ORAL_TABLET | Freq: Once | ORAL | Status: AC
  Administered 2019-05-30: 20:00:00 650 mg via ORAL
  Filled 2019-05-30: qty 2

## 2019-05-30 MED ORDER — TRANEXAMIC ACID 650 MG PO TABS
1300.0000 mg | ORAL_TABLET | Freq: Three times a day (TID) | ORAL | Status: DC
  Administered 2019-05-30 – 2019-05-31 (×3): 1300 mg via ORAL
  Filled 2019-05-30 (×5): qty 2

## 2019-05-30 MED ORDER — ONDANSETRON HCL 4 MG/2ML IJ SOLN: 4.0000 mg | Freq: Four times a day (QID) | INTRAMUSCULAR | Status: DC | PRN

## 2019-05-30 MED ORDER — ONDANSETRON HCL 4 MG PO TABS: 4.0000 mg | ORAL_TABLET | Freq: Four times a day (QID) | ORAL | Status: DC | PRN

## 2019-05-30 MED ORDER — SIMETHICONE 80 MG PO CHEW: 80.0000 mg | CHEWABLE_TABLET | Freq: Four times a day (QID) | ORAL | 0 refills | Status: DC | PRN

## 2019-05-30 MED ORDER — PRENATAL MULTIVITAMIN CH: 1.0000 | ORAL_TABLET | Freq: Every day | ORAL | Status: DC

## 2019-05-30 MED ORDER — TRANEXAMIC ACID 650 MG PO TABS: 1300.0000 mg | ORAL_TABLET | Freq: Three times a day (TID) | ORAL | 0 refills | Status: DC

## 2019-05-30 MED ORDER — TRANEXAMIC ACID 650 MG PO TABS: 1300.0000 mg | ORAL_TABLET | Freq: Three times a day (TID) | ORAL | 0 refills | Status: AC

## 2019-05-30 MED ORDER — SODIUM CHLORIDE 0.9% IV SOLUTION
Freq: Once | INTRAVENOUS | Status: AC
  Administered 2019-05-30: 20:00:00 via INTRAVENOUS

## 2019-05-30 NOTE — Telephone Encounter (Signed)
GYN Telephone Note  CBC Latest Ref Rng & Units 05/30/2019 01/17/2019 11/06/2018  WBC 4.0 - 10.5 K/uL 3.7(L) 3.2(L) 4.6  Hemoglobin 12.0 - 15.0 g/dL 5.5(LL) 14.3 8.9(L)  Hematocrit 36.0 - 46.0 % 21.1(L) 44.7 28.9(L)  Platelets 150 - 400 K/uL 383 263 423   I got Jodi Mendez's lab results from this morning. I called her at [phone].  She states she is taking the megace 2 tabs bid.   Yesterday, two pads with maybe a small clot with each pad, sometimes has spotting. Same today as yesterday and what has been going on. She is asymptomatic (no chest pain, sob, lightheadedness, dizziness)  Recommend she come in for 3U PRBC given today and she is amenable to plan and likely to take about 12 hours total.   Pt told to stop megace and lysteda sent in. Admitting called  Durene Romans MD Attending [facility] for [employer] [facility] (Faculty Practice) 05/30/2019 Time: [id number]

## 2019-05-30 NOTE — Progress Notes (Signed)
CRITICAL VALUE ALERT  Critical Value: Hgb 5.5  Date & Time Notied:  05/30/2019 11:04  Provider Notified: Will notify Dr Ilda Basset & Anesthesia

## 2019-05-30 NOTE — Progress Notes (Signed)
PCP - [facility] Cardiologist - Denies  PPM/ICD - Denies Device Orders - Denies Rep Notified N/A  Chest x-ray - N/A EKG - 10/06/2018 Stress Test - Denies ECHO - Denies Cardiac Cath - Denies  Sleep Study - Denies CPAP - Denies  Fasting Blood Sugar - N/A Checks Blood Sugar __N/A___ times a day  Blood Thinner Instructions: N/A Aspirin Instructions: N/A  ERAS Protcol - Yes PRE-SURGERY Ensure - N/A  COVID TEST- 06/01/2019   Anesthesia review: No  Patient denies shortness of breath, fever, cough and chest pain at PAT appointment   Coronavirus Screening  Have you experienced the following symptoms:  Cough yes/no: No Fever (>100.24F)  yes/no: No Runny nose yes/no: No Sore throat yes/no: No Difficulty breathing/shortness of breath  yes/no: No  Have you or a family member traveled in the last 14 days and where? yes/no: No   If the patient indicates "YES" to the above questions, their PAT will be rescheduled to limit the exposure to others and, the surgeon will be notified. THE PATIENT WILL NEED TO BE ASYMPTOMATIC FOR 14 DAYS.   If the patient is not experiencing any of these symptoms, the PAT nurse will instruct them to NOT bring anyone with them to their appointment since they may have these symptoms or traveled as well.   Please remind your patients and families that hospital visitation restrictions are in effect and the importance of the restrictions.     Patient verbalized understanding of instructions that were given to them at the PAT appointment. Patient was also instructed that they will need to review over the PAT instructions again at home before surgery.

## 2019-05-30 NOTE — Progress Notes (Signed)
Patient arrived to the department via wheelchair. Patient is alert and oriented x4. Vital signs stable. Will notify MD that patient has arrived and await orders.

## 2019-05-30 NOTE — H&P (Signed)
Obstetrics & Gynecology H&P   Date of Admission: 05/30/2019   Primary OBGYN: [facility] Primary Care Provider: Scot Jun  Reason for Admission: blood transfusion due to low H/H at pre op labs  History of Present Illness: Ms. Picker is a 43 y.o. G1P0010 (No LMP recorded. (Menstrual status: Irregular Periods).), with the above CC. PMHx is significant for fibroids, anemia, HTN.    Patient asymptomatic from anemia. Having some spotting. Hasn't picked up the Lysteda yet.   ROS: A 12-point review of systems was performed and negative, except as stated in the above HPI.  OBGYN History: As per HPI. OB History  Gravida Para Term Preterm AB Living  1 0     1    SAB TAB Ectopic Multiple Live Births      1        # Outcome Date GA Lbr Len/2nd Weight Sex Delivery Anes PTL Lv  1 Ectopic               Past Medical History: Past Medical History:  Diagnosis Date  . Anemia   . Hypertension 11/06/2018  . Transient hypertension     Past Surgical History: Past Surgical History:  Procedure Laterality Date  . DILATATION & CURETTAGE/HYSTEROSCOPY WITH MYOSURE N/A 01/31/2019   Procedure: DILATATION & CURETTAGE/HYSTEROSCOPY WITH MYOSURE AND MYOMECTOMY;  Surgeon: Aletha Halim, MD;  Location: [facility];  Service: Gynecology;  Laterality: N/A;  . UNILATERAL SALPINGECTOMY Left 2007   Ectopic pregnancy. suprapubic mini-lap    Family History:  Family History  Problem Relation Age of Onset  . Diabetes Mother   . Hypertension Mother     Social History:  Social History   Socioeconomic History  . Marital status: Divorced    Spouse name: Not on file  . Number of children: 0  . Years of education: Not on file  . Highest education level: Some college, no degree  Occupational History  . Not on file  Social Needs  . Financial resource strain: Not on file  . Food insecurity    Worry: Not on file    Inability: Not on file  . Transportation needs     Medical: Yes    Non-medical: Yes  Tobacco Use  . Smoking status: Never Smoker  . Smokeless tobacco: Never Used  Substance and Sexual Activity  . Alcohol use: Yes    Comment: social  . Drug use: No    Comment: "edible a time or two, nothing current"  . Sexual activity: Not Currently    Comment: not within the last couple of months/sexual active with women  Lifestyle  . Physical activity    Days per week: Not on file    Minutes per session: Not on file  . Stress: Not on file  Relationships  . Social Herbalist on phone: Not on file    Gets together: Not on file    Attends religious service: Not on file    Active member of club or organization: Not on file    Attends meetings of clubs or organizations: Not on file    Relationship status: Not on file  . Intimate partner violence    Fear of current or ex partner: Not on file    Emotionally abused: Not on file    Physically abused: Not on file    Forced sexual activity: Not on file  Other Topics Concern  . Not on file  Social History Narrative  .  Not on file     Allergy: Allergies  Allergen Reactions  . Sulfa Antibiotics Hives and Itching    Current Outpatient Medications: Medications Prior to Admission  Medication Sig Dispense Refill Last Dose  . amLODipine (NORVASC) 5 MG tablet Take 2 tablets (10 mg total) by mouth daily. 60 tablet 2   . ferrous gluconate (FERGON) 324 MG tablet Take 1 tablet (324 mg total) by mouth 2 (two) times daily with a meal. (Patient taking differently: Take 324 mg by mouth See admin instructions. Twice daily during heavy bleeding) 60 tablet 1   . senna (SENNA-LAX) 8.6 MG tablet Take 1 tablet by mouth daily as needed for constipation.         Hospital Medications: No current facility-administered medications for this encounter.      Physical Exam:   Current Vital Signs 24h Vital Sign Ranges  T 98.1 F (36.7 C) Temp  Avg: 98 F (36.7 C)  Min: 97.8 F (36.6 C)  Max: 98.1 F  (36.7 C)  BP (!) 143/77 BP  Min: 143/77  Max: 161/79  HR 77 Pulse  Avg: 75.5  Min: 74  Max: 77  RR 16 Resp  Avg: 18  Min: 16  Max: 20  SaO2 100 %   SpO2  Avg: 100 %  Min: 100 %  Max: 100 %       24 Hour I/O Current Shift I/O  Time Ins Outs No intake/output data recorded. No intake/output data recorded.    There is no height or weight on file to calculate BMI. General appearance: Well nourished, well developed female in no acute distress.  Cardiovascular: S1, S2 normal, no murmur, rub or gallop, regular rate and rhythm Respiratory:  Clear to auscultation bilateral. Normal respiratory effort Abdomen: positive bowel sounds and no masses, hernias; diffusely non tender to palpation, non distended Neuro/Psych:  Normal mood and affect.  Skin:  Warm and dry.  Extremities: no clubbing, cyanosis, or edema.    Laboratory: Recent Labs  Lab 05/30/19 0932  WBC 3.7*  HGB 5.5*  HCT 21.1*  PLT 383   Recent Labs  Lab 05/30/19 0932  NA 137  K 3.6  CL 107  CO2 22  BUN 8  CREATININE 0.65  CALCIUM 8.9  PROT 8.1  BILITOT 0.6  ALKPHOS 79  ALT 16  AST 20  GLUCOSE 110*    Recent Labs  Lab 05/30/19 0945  ABORH O POS    Imaging:  No new imaging  Assessment: pt stable  Plan: Plan for repeat cbc and 3U PRBCs. Will start Lysteda while inpatient. Anticipate d/c to home tomorrow morning.   Total time taking care of the patient was 15 minutes, with greater than 50% of the time spent in face to face interaction with the patient.  Durene Romans MD Attending [facility] [employer] [facility] (Faculty Practice) [phone]

## 2019-05-31 LAB — HEMOGLOBIN AND HEMATOCRIT, BLOOD
HCT: 32.6 % — ABNORMAL LOW (ref 36.0–46.0)
Hemoglobin: 10 g/dL — ABNORMAL LOW (ref 12.0–15.0)

## 2019-05-31 MED ORDER — SODIUM CHLORIDE 0.9 % IV SOLN
510.0000 mg | Freq: Once | INTRAVENOUS | Status: AC
  Administered 2019-05-31: 510 mg via INTRAVENOUS
  Filled 2019-05-31: qty 17

## 2019-05-31 MED ORDER — SODIUM CHLORIDE 0.9 % IV SOLN: 510.0000 mg | INTRAVENOUS | Status: DC

## 2019-05-31 MED FILL — POLYETHYLENE GLYCOL 3350 PO: 17 | 30 days supply | Qty: 510 | Fill #0

## 2019-05-31 MED FILL — TRANEXAMIC ACID 650 MG TAB: 650 | 5 days supply | Qty: 26 | Fill #0

## 2019-05-31 MED FILL — SIMETHICONE 80 MG CHEW: 80 | 8 days supply | Qty: 30 | Fill #0

## 2019-05-31 NOTE — Anesthesia Preprocedure Evaluation (Addendum)
Anesthesia Evaluation  Patient identified by MRN, date of birth, ID band Patient awake    Reviewed: Allergy & Precautions, NPO status , Patient's Chart, lab work & pertinent test results  Airway Mallampati: II  TM Distance: >3 FB     Dental  (+) Dental Advisory Given   Pulmonary neg pulmonary ROS,    breath sounds clear to auscultation       Cardiovascular hypertension, Pt. on medications  Rhythm:Regular Rate:Normal     Neuro/Psych negative neurological ROS     GI/Hepatic negative GI ROS, Neg liver ROS,   Endo/Other  negative endocrine ROS  Renal/GU negative Renal ROS     Musculoskeletal   Abdominal   Peds  Hematology  (+) anemia ,   Anesthesia Other Findings   Reproductive/Obstetrics                            Lab Results  Component Value Date   WBC 3.8 (L) 05/30/2019   HGB 10.0 (L) 05/31/2019   HCT 32.6 (L) 05/31/2019   MCV 63.5 (L) 05/30/2019   PLT 362 05/30/2019   Lab Results  Component Value Date   CREATININE 0.65 05/30/2019   BUN 8 05/30/2019   NA 137 05/30/2019   K 3.6 05/30/2019   CL 107 05/30/2019   CO2 22 05/30/2019    Anesthesia Physical Anesthesia Plan  ASA: III  Anesthesia Plan: General   Post-op Pain Management:    Induction: Intravenous  PONV Risk Score and Plan: 3 and Dexamethasone, Ondansetron and Treatment may vary due to age or medical condition  Airway Management Planned: Oral ETT  Additional Equipment:   Intra-op Plan:   Post-operative Plan: Extubation in OR  Informed Consent: I have reviewed the patients History and Physical, chart, labs and discussed the procedure including the risks, benefits and alternatives for the proposed anesthesia with the patient or authorized representative who has indicated his/her understanding and acceptance.     Dental advisory given  Plan Discussed with: CRNA  Anesthesia Plan Comments: ( )        Anesthesia Quick Evaluation

## 2019-05-31 NOTE — Discharge Instructions (Signed)
Anemia  Anemia is a condition in which you do not have enough red blood cells or hemoglobin. Hemoglobin is a substance in red blood cells that carries oxygen. When you do not have enough red blood cells or hemoglobin (are anemic), your body cannot get enough oxygen and your organs may not work properly. As a result, you may feel very tired or have other problems. What are the causes? Common causes of anemia include:  Excessive bleeding. Anemia can be caused by excessive bleeding inside or outside the body, including bleeding from the intestine or from periods in women.  Poor nutrition.  Long-lasting (chronic) kidney, thyroid, and liver disease.  Bone marrow disorders.  Cancer and treatments for cancer.  HIV (human immunodeficiency virus) and AIDS (acquired immunodeficiency syndrome).  Treatments for HIV and AIDS.  Spleen problems.  Blood disorders.  Infections, medicines, and autoimmune disorders that destroy red blood cells. What are the signs or symptoms? Symptoms of this condition include:  Minor weakness.  Dizziness.  Headache.  Feeling heartbeats that are irregular or faster than normal (palpitations).  Shortness of breath, especially with exercise.  Paleness.  Cold sensitivity.  Indigestion.  Nausea.  Difficulty sleeping.  Difficulty concentrating. Symptoms may occur suddenly or develop slowly. If your anemia is mild, you may not have symptoms. How is this diagnosed? This condition is diagnosed based on:  Blood tests.  Your medical history.  A physical exam.  Bone marrow biopsy. Your health care provider may also check your stool (feces) for blood and may do additional testing to look for the cause of your bleeding. You may also have other tests, including:  Imaging tests, such as a CT scan or MRI.  Endoscopy.  Colonoscopy. How is this treated? Treatment for this condition depends on the cause. If you continue to lose a lot of blood, you may  need to be treated at a hospital. Treatment may include:  Taking supplements of iron, vitamin M08, or folic acid.  Taking a hormone medicine (erythropoietin) that can help to stimulate red blood cell growth.  Having a blood transfusion. This may be needed if you lose a lot of blood.  Making changes to your diet.  Having surgery to remove your spleen. Follow these instructions at home:  Take over-the-counter and prescription medicines only as told by your health care provider.  Take supplements only as told by your health care provider.  Follow any diet instructions that you were given.  Keep all follow-up visits as told by your health care provider. This is important. Contact a health care provider if:  You develop new bleeding anywhere in the body. Get help right away if:  You are very weak.  You are short of breath.  You have pain in your abdomen or chest.  You are dizzy or feel faint.  You have trouble concentrating.  You have bloody or black, tarry stools.  You vomit repeatedly or you vomit up blood. Summary  Anemia is a condition in which you do not have enough red blood cells or enough of a substance in your red blood cells that carries oxygen (hemoglobin).  Symptoms may occur suddenly or develop slowly.  If your anemia is mild, you may not have symptoms.  This condition is diagnosed with blood tests as well as a medical history and physical exam. Other tests may be needed.  Treatment for this condition depends on the cause of the anemia. This information is not intended to replace advice given to you by  your health care provider. Make sure you discuss any questions you have with your health care provider. °Document Released: 09/23/2004 Document Revised: 07/29/2017 Document Reviewed: 09/17/2016 °Elsevier Patient Education © 2020 Elsevier Inc. ° °

## 2019-05-31 NOTE — Progress Notes (Signed)
Anesthesia Chart Review:  Case: [id number] Date/Time: 06/05/19 0945   Procedures:      TOTAL LAPAROSCOPIC HYSTERECTOMY WITH SALPINGECTOMY (Bilateral )     CYSTOSCOPY (N/A )   Anesthesia type: Choice   Pre-op diagnosis:      FIBROID     AUB   [facility] / [facility]   Surgeon: Aletha Halim, MD      DISCUSSION: Patient is a 43 year old female scheduled for the above procedure.   History includes never smoker, HTN (no BP medications), fibroids, anemia with abnormal uterine bleeding (requiring admission for transfusion 02/09/13, 04/20/16, 2.6/20 with HGB 2.7, 05/30/19). S/p D&C/hysteroscopy/Myosure/Partial Myomectomy 01/31/19 (pathology: leiomyomata, benign endometrium, no malignancy identified).  PAT labs showed an H/H 5.5/21.1 which was called to Dr. Ilda Basset, and patient admitted 05/30/19 for 3 Units PRBC. 05/31/19 H/H 10.0/32.6. Notes indicate anticipated discharge home on 05/31/19. She was started on Lysteda.   05/30/19 COVID-19 test negative. Plan repeat H/H and T&S on the day of surgery.   VS: BP (!) 146/80 Comment: rechecked/notified Gita Kudo. RN  Pulse 74   Temp 36.6 C   Resp 20   Ht 5' 4.5" (1.638 m)   Wt 72.2 kg   SpO2 100%   BMI 26.90 kg/m    PROVIDERS: Scot Jun, FNP is PCP   LABS: Preoperative labs noted. H/H 5.5/21.1 was called to surgeon 05/30/19. See DISCUSSION. (all labs ordered are listed, but only abnormal results are displayed)  Labs Reviewed  CBC - Abnormal; Notable for the following components:      Result Value   WBC 3.7 (*)    RBC 3.31 (*)    Hemoglobin 5.5 (*)    HCT 21.1 (*)    MCV 63.7 (*)    MCH 16.6 (*)    MCHC 26.1 (*)    RDW 24.6 (*)    All other components within normal limits  COMPREHENSIVE METABOLIC PANEL - Abnormal; Notable for the following components:   Glucose, Bld 110 (*)    All other components within normal limits  TYPE AND SCREEN     IMAGES: US Transvaginal Pelvis 10/19/18: IMPRESSION: - Enlarged uterus  containing leiomyomata including a 4.5 cm diameter central submucosal leiomyoma. - Interval decrease in size of hypoechoic complicated cyst within LEFT ovary likely representing an evolving hemorrhagic cyst. - No new intrapelvic abnormalities.   EKG: 10/05/18: Sinus rhythm Nonspecific T abnrm, anterolateral leads Prolonged QT interval No previous ECGs available Confirmed by Ezequiel Essex [phone]) on 10/05/2018 6:31:21 AM   CV: N/A   Past Medical History:  Diagnosis Date  . Anemia   . Hypertension 11/06/2018  . Transient hypertension     Past Surgical History:  Procedure Laterality Date  . DILATATION & CURETTAGE/HYSTEROSCOPY WITH MYOSURE N/A 01/31/2019   Procedure: DILATATION & CURETTAGE/HYSTEROSCOPY WITH MYOSURE AND MYOMECTOMY;  Surgeon: Aletha Halim, MD;  Location: [place];  Service: Gynecology;  Laterality: N/A;  . UNILATERAL SALPINGECTOMY Left 2007   Ectopic pregnancy. suprapubic mini-lap    MEDICATIONS: No current facility-administered medications for this encounter.    . polyethylene glycol (MIRALAX / GLYCOLAX) 17 g packet  . simethicone (GAS-X) 80 MG chewable tablet  . tranexamic acid (LYSTEDA) 650 MG TABS tablet   . ferumoxytol (FERAHEME) 510 mg in sodium chloride 0.9 % 100 mL IVPB  . ondansetron (ZOFRAN) tablet 4 mg   Or  . ondansetron (ZOFRAN) injection 4 mg  . tranexamic acid (LYSTEDA) tablet 1,300 mg  . zolpidem (  AMBIEN) tablet 5 mg     Myra Gianotti, PA-C Surgical Short Stay/Anesthesiology [facility] Phone [phone] [facility] Phone [phone] 05/31/2019 10:25 AM

## 2019-05-31 NOTE — Progress Notes (Signed)
GYN Note Patient sleeping, but no issues overnight or today. S/p feraheme this morning. Okay for d/c to home  Aletha Halim, Brooke Bonito MD Attending [facility] [employer] (Faculty Practice) 05/31/2019 Time: 1215pm

## 2019-05-31 NOTE — Discharge Summary (Signed)
Gynecology Discharge Summary Date of Admission: 05/30/2019 Date of Discharge: 05/31/2019  The patient was admitted, as scheduled, and underwent a blood transfusion for 3U of PRBCs for chronic anemia, noted at her pre op labs.   CBC Latest Ref Rng & Units 05/30/2019 05/30/2019 01/17/2019  WBC 4.0 - 10.5 K/uL 3.8(L) 3.7(L) 3.2(L)  Hemoglobin 12.0 - 15.0 g/dL 5.9(LL) 5.5(LL) 14.3  Hematocrit 36.0 - 46.0 % 23.1(L) 21.1(L) 44.7  Platelets 150 - 400 K/uL 362 383 263   She was also started on lysteda for her spotting. She was also given a dose of Feraheme with plans to repeat post op after her hysterectomy next week.  On the day of discharge, she was doing well and not having any issues or problems. A post transfusion H/H wasn't done due to the non acute nature of her chronic bleeding and spotting.    Allergies as of 05/31/2019      Reactions   Sulfa Antibiotics Hives, Itching      Medication List    STOP taking these medications   megestrol 40 MG tablet Commonly known as: MEGACE     TAKE these medications   amLODipine 5 MG tablet Commonly known as: NORVASC Take 2 tablets (10 mg total) by mouth daily.   ferrous gluconate 324 MG tablet Commonly known as: FERGON Take 1 tablet (324 mg total) by mouth 2 (two) times daily with a meal. What changed:   when to take this  additional instructions   polyethylene glycol 17 g packet Commonly known as: MIRALAX / GLYCOLAX Take 17 g by mouth daily.   Senna-Lax 8.6 MG tablet Generic drug: senna Take 1 tablet by mouth daily as needed for constipation.   simethicone 80 MG chewable tablet Commonly known as: Gas-X Chew 1 tablet (80 mg total) by mouth every 6 (six) hours as needed for flatulence.   tranexamic acid 650 MG Tabs tablet Commonly known as: LYSTEDA Take 2 tablets (1,300 mg total) by mouth 3 (three) times daily for 13 doses. What changed: additional instructions       Future Appointments  Date Time Provider [place]   06/01/2019  8:35 AM [facility] None

## 2019-05-31 NOTE — TOC Initial Note (Signed)
Transition of Care [facility]) - Initial/Assessment Note    Patient Details  Name: Jodi Mendez MRN: [id number] Date of Birth: 05/25/1976  Transition [facility] Care [facility]) CM/SW Contact:    Marilu Favre, RN Phone Number: 05/31/2019, 10:17 AM  Clinical Narrative:                 Patient from home with mother. Patient is not active with PCP.   [facility]iscussed [facility] patient does want to follow up with them . Information provided.  Confirmed patient does not have insurance. Explained [place] program. Patient voiced understanding and has $3 co pay per prescription.   Expected Discharge Plan: Home/Self Care     Patient Goals and CMS Choice Patient states their goals for this hospitalization and ongoing recovery are:: to go home CMS Medicare.gov Compare Post Acute Care list provided to:: Patient Choice offered to / list presented to : NA  Expected Discharge Plan and Services Expected Discharge Plan: Home/Self Care In-house Referral: Financial Counselor, PCP / [facility] Discharge Planning Services: [facility], [facility], Medication Assistance   Living arrangements for the past 2 months: Single Family Home Expected Discharge Date: 05/31/19               DME Arranged: N/A         HH Arranged: NA          Prior Living Arrangements/Services Living arrangements for the past 2 months: Single Family Home Lives with:: Parents Patient language and need for interpreter reviewed:: Yes Do you feel safe going back to the place where you live?: Yes      Need for Family Participation in Patient Care: No (Comment) Care giver support system in place?: Yes (comment)   Criminal Activity/Legal Involvement Pertinent to Current Situation/Hospitalization: No - Comment as needed  Activities of Daily Living      Permission Sought/Granted   Permission granted to share information with : Yes, Verbal Permission Granted     Permission granted to share info  w AGENCY: TOC        Emotional Assessment   Attitude/Demeanor/Rapport: Engaged Affect (typically observed): Accepting Orientation: : Oriented to Self, Oriented to Place, Oriented to  Time, Oriented to Situation Alcohol / Substance Use: Not Applicable Psych Involvement: No (comment)  Admission diagnosis:  Anemia  Patient Active Problem List   Diagnosis Date Noted  . Anemia 05/30/2019  . Hypertension 11/06/2018  . Complex cyst of left ovary 10/06/2018  . Chronic anemia 10/05/2018  . Menorrhagia 02/09/2013  . Leiomyoma of uterus 02/09/2013   PCP:  Scot Jun, FNP Pharmacy:   [facility] [address] [place]), [place] - [id number] [place] [address] Phone: [phone] Fax: [phone]  [facility] [place], [place] - [address] [place] [address] Phone: [phone] Fax: [phone]     Social Determinants of Health (SDOH) Interventions    Readmission Risk Interventions No flowsheet data found.

## 2019-06-01 ENCOUNTER — Other Ambulatory Visit (HOSPITAL_COMMUNITY)
Admission: RE | Admit: 2019-06-01 | Discharge: 2019-06-01 | Disposition: A | Discharge status: Home / Self Care | Payer: Self-pay | Source: Ambulatory Visit | Attending: Obstetrics and Gynecology | Admitting: Obstetrics and Gynecology

## 2019-06-01 DIAGNOSIS — D259 Leiomyoma of uterus, unspecified: Secondary | ICD-10-CM | POA: Insufficient documentation

## 2019-06-01 DIAGNOSIS — Z01812 Encounter for preprocedural laboratory examination: Secondary | ICD-10-CM | POA: Insufficient documentation

## 2019-06-01 DIAGNOSIS — Z20828 Contact with and (suspected) exposure to other viral communicable diseases: Secondary | ICD-10-CM | POA: Insufficient documentation

## 2019-06-01 DIAGNOSIS — N939 Abnormal uterine and vaginal bleeding, unspecified: Secondary | ICD-10-CM | POA: Insufficient documentation

## 2019-06-01 LAB — TYPE AND SCREEN
ABO/RH(D): O POS
Antibody Screen: NEGATIVE
Unit division: 0
Unit division: 0
Unit division: 0

## 2019-06-01 LAB — BPAM RBC
Blood Product Expiration Date: 202011032359
Blood Product Expiration Date: 202011032359
Blood Product Expiration Date: 202011042359
ISSUE DATE / TIME: 202009302006
ISSUE DATE / TIME: 202009302331
ISSUE DATE / TIME: 202010010301
Unit Type and Rh: 5100
Unit Type and Rh: 5100
Unit Type and Rh: 5100

## 2019-06-03 LAB — NOVEL CORONAVIRUS, NAA (HOSP ORDER, SEND-OUT TO REF LAB; TAT 18-24 HRS): SARS-CoV-2, NAA: NOT DETECTED

## 2019-06-04 ENCOUNTER — Encounter: Payer: Self-pay | Admitting: Obstetrics and Gynecology

## 2019-06-05 ENCOUNTER — Inpatient Hospital Stay (HOSPITAL_COMMUNITY)
Admission: RE | Admit: 2019-06-05 | Discharge: 2019-06-08 | DRG: 743 | Disposition: A | Discharge status: Home / Self Care | Payer: Self-pay | Attending: Obstetrics and Gynecology | Admitting: Obstetrics and Gynecology

## 2019-06-05 ENCOUNTER — Observation Stay (HOSPITAL_COMMUNITY): Payer: Self-pay | Admitting: Vascular Surgery

## 2019-06-05 ENCOUNTER — Encounter: Payer: Self-pay | Admitting: *Deleted

## 2019-06-05 ENCOUNTER — Encounter (HOSPITAL_COMMUNITY): Payer: Self-pay

## 2019-06-05 ENCOUNTER — Encounter (HOSPITAL_COMMUNITY)
Admission: RE | Disposition: A | Discharge status: Home / Self Care | Payer: Self-pay | Source: Home / Self Care | Attending: Obstetrics and Gynecology

## 2019-06-05 ENCOUNTER — Other Ambulatory Visit: Payer: Self-pay

## 2019-06-05 DIAGNOSIS — Z90721 Acquired absence of ovaries, unilateral: Secondary | ICD-10-CM

## 2019-06-05 DIAGNOSIS — D649 Anemia, unspecified: Secondary | ICD-10-CM | POA: Diagnosis present

## 2019-06-05 DIAGNOSIS — Z5331 Laparoscopic surgical procedure converted to open procedure: Secondary | ICD-10-CM

## 2019-06-05 DIAGNOSIS — N736 Female pelvic peritoneal adhesions (postinfective): Secondary | ICD-10-CM | POA: Diagnosis present

## 2019-06-05 DIAGNOSIS — Z9079 Acquired absence of other genital organ(s): Secondary | ICD-10-CM

## 2019-06-05 DIAGNOSIS — N939 Abnormal uterine and vaginal bleeding, unspecified: Secondary | ICD-10-CM | POA: Diagnosis present

## 2019-06-05 DIAGNOSIS — D250 Submucous leiomyoma of uterus: Principal | ICD-10-CM | POA: Diagnosis present

## 2019-06-05 DIAGNOSIS — Z90710 Acquired absence of both cervix and uterus: Secondary | ICD-10-CM | POA: Diagnosis present

## 2019-06-05 DIAGNOSIS — Z20828 Contact with and (suspected) exposure to other viral communicable diseases: Secondary | ICD-10-CM | POA: Diagnosis present

## 2019-06-05 DIAGNOSIS — I10 Essential (primary) hypertension: Secondary | ICD-10-CM | POA: Diagnosis present

## 2019-06-05 DIAGNOSIS — N802 Endometriosis of fallopian tube: Secondary | ICD-10-CM | POA: Diagnosis present

## 2019-06-05 DIAGNOSIS — D259 Leiomyoma of uterus, unspecified: Secondary | ICD-10-CM

## 2019-06-05 DIAGNOSIS — N83292 Other ovarian cyst, left side: Secondary | ICD-10-CM | POA: Diagnosis present

## 2019-06-05 DIAGNOSIS — D638 Anemia in other chronic diseases classified elsewhere: Secondary | ICD-10-CM | POA: Diagnosis present

## 2019-06-05 HISTORY — PX: LAPAROSCOPY: SHX197

## 2019-06-05 HISTORY — PX: ABDOMINAL HYSTERECTOMY: SHX81

## 2019-06-05 HISTORY — PX: CYSTOSCOPY: SHX5120

## 2019-06-05 LAB — CBC
HCT: 36.2 % (ref 36.0–46.0)
Hemoglobin: 11 g/dL — ABNORMAL LOW (ref 12.0–15.0)
MCH: 23.2 pg — ABNORMAL LOW (ref 26.0–34.0)
MCHC: 30.4 g/dL (ref 30.0–36.0)
MCV: 76.4 fL — ABNORMAL LOW (ref 80.0–100.0)
Platelets: 214 10*3/uL (ref 150–400)
RBC: 4.74 MIL/uL (ref 3.87–5.11)
WBC: 4.7 10*3/uL (ref 4.0–10.5)

## 2019-06-05 LAB — POCT PREGNANCY, URINE: Preg Test, Ur: NEGATIVE

## 2019-06-05 LAB — TYPE AND SCREEN
ABO/RH(D): O POS
Antibody Screen: NEGATIVE

## 2019-06-05 SURGERY — LAPAROSCOPY, DIAGNOSTIC
Anesthesia: General | Site: Bladder

## 2019-06-05 MED ORDER — KETAMINE HCL 50 MG/5ML IJ SOSY
PREFILLED_SYRINGE | INTRAMUSCULAR | Status: AC
  Filled 2019-06-05: qty 5

## 2019-06-05 MED ORDER — CEFAZOLIN SODIUM-DEXTROSE 2-4 GM/100ML-% IV SOLN
2.0000 g | INTRAVENOUS | Status: AC
  Administered 2019-06-05: 2 g via INTRAVENOUS

## 2019-06-05 MED ORDER — FENTANYL CITRATE (PF) 250 MCG/5ML IJ SOLN
INTRAMUSCULAR | Status: DC | PRN
  Administered 2019-06-05: 50 ug via INTRAVENOUS
  Administered 2019-06-05: 100 ug via INTRAVENOUS
  Administered 2019-06-05 (×2): 50 ug via INTRAVENOUS

## 2019-06-05 MED ORDER — STERILE WATER FOR IRRIGATION IR SOLN
Status: DC | PRN
  Administered 2019-06-05: 300 mL

## 2019-06-05 MED ORDER — ACETAMINOPHEN 500 MG PO TABS: 1000.0000 mg | ORAL_TABLET | Freq: Once | ORAL | Status: DC

## 2019-06-05 MED ORDER — AMLODIPINE BESYLATE 10 MG PO TABS
10.0000 mg | ORAL_TABLET | Freq: Every day | ORAL | Status: DC
  Administered 2019-06-06 – 2019-06-08 (×3): 10 mg via ORAL
  Filled 2019-06-05 (×3): qty 1

## 2019-06-05 MED ORDER — NALOXONE HCL 0.4 MG/ML IJ SOLN: 0.4000 mg | INTRAMUSCULAR | Status: DC | PRN

## 2019-06-05 MED ORDER — SUGAMMADEX SODIUM 500 MG/5ML IV SOLN
INTRAVENOUS | Status: AC
  Filled 2019-06-05: qty 5

## 2019-06-05 MED ORDER — BUPIVACAINE HCL (PF) 0.5 % IJ SOLN
INTRAMUSCULAR | Status: AC
  Filled 2019-06-05: qty 30

## 2019-06-05 MED ORDER — FENTANYL CITRATE (PF) 250 MCG/5ML IJ SOLN
INTRAMUSCULAR | Status: AC
  Filled 2019-06-05: qty 5

## 2019-06-05 MED ORDER — SUCCINYLCHOLINE CHLORIDE 200 MG/10ML IV SOSY
PREFILLED_SYRINGE | INTRAVENOUS | Status: AC
  Filled 2019-06-05: qty 10

## 2019-06-05 MED ORDER — LACTATED RINGERS IV SOLN
INTRAVENOUS | Status: DC
  Administered 2019-06-05 – 2019-06-07 (×5): via INTRAVENOUS

## 2019-06-05 MED ORDER — ALBUMIN HUMAN 5 % IV SOLN
INTRAVENOUS | Status: DC | PRN
  Administered 2019-06-05: 13:00:00 via INTRAVENOUS

## 2019-06-05 MED ORDER — CEFAZOLIN SODIUM-DEXTROSE 2-4 GM/100ML-% IV SOLN
INTRAVENOUS | Status: AC
  Filled 2019-06-05: qty 100

## 2019-06-05 MED ORDER — EPHEDRINE 5 MG/ML INJ
INTRAVENOUS | Status: AC
  Filled 2019-06-05: qty 10

## 2019-06-05 MED ORDER — ACETAMINOPHEN 10 MG/ML IV SOLN
INTRAVENOUS | Status: AC
  Filled 2019-06-05: qty 100

## 2019-06-05 MED ORDER — SODIUM CHLORIDE 0.9 % IV SOLN
INTRAVENOUS | Status: DC | PRN
  Administered 2019-06-05: 15 ug/min via INTRAVENOUS

## 2019-06-05 MED ORDER — PROPOFOL 10 MG/ML IV BOLUS
INTRAVENOUS | Status: DC | PRN
  Administered 2019-06-05: 200 mg via INTRAVENOUS

## 2019-06-05 MED ORDER — ROCURONIUM BROMIDE 10 MG/ML (PF) SYRINGE
PREFILLED_SYRINGE | INTRAVENOUS | Status: AC
  Filled 2019-06-05: qty 10

## 2019-06-05 MED ORDER — MIDAZOLAM HCL 2 MG/2ML IJ SOLN
INTRAMUSCULAR | Status: AC
  Filled 2019-06-05: qty 2

## 2019-06-05 MED ORDER — EPHEDRINE SULFATE-NACL 50-0.9 MG/10ML-% IV SOSY
PREFILLED_SYRINGE | INTRAVENOUS | Status: DC | PRN
  Administered 2019-06-05: 5 mg via INTRAVENOUS
  Administered 2019-06-05 (×2): 10 mg via INTRAVENOUS

## 2019-06-05 MED ORDER — DIPHENHYDRAMINE HCL 12.5 MG/5ML PO ELIX: 12.5000 mg | ORAL_SOLUTION | Freq: Four times a day (QID) | ORAL | Status: DC | PRN

## 2019-06-05 MED ORDER — LIDOCAINE 2% (20 MG/ML) 5 ML SYRINGE
INTRAMUSCULAR | Status: AC
  Filled 2019-06-05: qty 10

## 2019-06-05 MED ORDER — HYDROMORPHONE 1 MG/ML IV SOLN
INTRAVENOUS | Status: AC
  Filled 2019-06-05: qty 30

## 2019-06-05 MED ORDER — DEXAMETHASONE SODIUM PHOSPHATE 10 MG/ML IJ SOLN
INTRAMUSCULAR | Status: AC
  Filled 2019-06-05: qty 2

## 2019-06-05 MED ORDER — MIDAZOLAM HCL 2 MG/2ML IJ SOLN
INTRAMUSCULAR | Status: DC | PRN
  Administered 2019-06-05: 2 mg via INTRAVENOUS

## 2019-06-05 MED ORDER — ONDANSETRON HCL 4 MG/2ML IJ SOLN
INTRAMUSCULAR | Status: DC | PRN
  Administered 2019-06-05: 4 mg via INTRAVENOUS

## 2019-06-05 MED ORDER — ACETAMINOPHEN 10 MG/ML IV SOLN
INTRAVENOUS | Status: DC | PRN
  Administered 2019-06-05: 1000 mg via INTRAVENOUS

## 2019-06-05 MED ORDER — PHENYLEPHRINE 40 MCG/ML (10ML) SYRINGE FOR IV PUSH (FOR BLOOD PRESSURE SUPPORT)
PREFILLED_SYRINGE | INTRAVENOUS | Status: AC
  Filled 2019-06-05: qty 10

## 2019-06-05 MED ORDER — 0.9 % SODIUM CHLORIDE (POUR BTL) OPTIME
TOPICAL | Status: DC | PRN
  Administered 2019-06-05: 1000 mL

## 2019-06-05 MED ORDER — HYDROMORPHONE 1 MG/ML IV SOLN
INTRAVENOUS | Status: DC
  Administered 2019-06-05: 30 mg via INTRAVENOUS
  Administered 2019-06-05: 2.1 mg via INTRAVENOUS
  Administered 2019-06-06: 0.6 mg via INTRAVENOUS
  Administered 2019-06-06: 0.9 mg via INTRAVENOUS

## 2019-06-05 MED ORDER — LACTATED RINGERS IV SOLN
INTRAVENOUS | Status: DC
  Administered 2019-06-05 (×2): via INTRAVENOUS

## 2019-06-05 MED ORDER — ONDANSETRON HCL 4 MG/2ML IJ SOLN
4.0000 mg | Freq: Four times a day (QID) | INTRAMUSCULAR | Status: DC | PRN
  Administered 2019-06-07: 4 mg via INTRAVENOUS
  Filled 2019-06-05: qty 2

## 2019-06-05 MED ORDER — FENTANYL CITRATE (PF) 100 MCG/2ML IJ SOLN: 25.0000 ug | INTRAMUSCULAR | Status: DC | PRN

## 2019-06-05 MED ORDER — DEXMEDETOMIDINE HCL 200 MCG/2ML IV SOLN
INTRAVENOUS | Status: DC | PRN
  Administered 2019-06-05: 20 ug via INTRAVENOUS
  Administered 2019-06-05: 8 ug via INTRAVENOUS
  Administered 2019-06-05: 12 ug via INTRAVENOUS

## 2019-06-05 MED ORDER — KETAMINE HCL 10 MG/ML IJ SOLN
INTRAMUSCULAR | Status: DC | PRN
  Administered 2019-06-05: 10 mg via INTRAVENOUS
  Administered 2019-06-05: 20 mg via INTRAVENOUS
  Administered 2019-06-05 (×2): 10 mg via INTRAVENOUS

## 2019-06-05 MED ORDER — SUGAMMADEX SODIUM 200 MG/2ML IV SOLN
INTRAVENOUS | Status: DC | PRN
  Administered 2019-06-05: 200 mg via INTRAVENOUS

## 2019-06-05 MED ORDER — ROCURONIUM BROMIDE 10 MG/ML (PF) SYRINGE
PREFILLED_SYRINGE | INTRAVENOUS | Status: DC | PRN
  Administered 2019-06-05: 30 mg via INTRAVENOUS
  Administered 2019-06-05: 50 mg via INTRAVENOUS
  Administered 2019-06-05: 40 mg via INTRAVENOUS
  Administered 2019-06-05: 10 mg via INTRAVENOUS

## 2019-06-05 MED ORDER — PROPOFOL 10 MG/ML IV BOLUS
INTRAVENOUS | Status: AC
  Filled 2019-06-05: qty 40

## 2019-06-05 MED ORDER — ONDANSETRON HCL 4 MG/2ML IJ SOLN
INTRAMUSCULAR | Status: AC
  Filled 2019-06-05: qty 4

## 2019-06-05 MED ORDER — GLYCOPYRROLATE PF 0.2 MG/ML IJ SOSY
PREFILLED_SYRINGE | INTRAMUSCULAR | Status: AC
  Filled 2019-06-05: qty 1

## 2019-06-05 MED ORDER — DIPHENHYDRAMINE HCL 50 MG/ML IJ SOLN: 12.5000 mg | Freq: Four times a day (QID) | INTRAMUSCULAR | Status: DC | PRN

## 2019-06-05 MED ORDER — PANTOPRAZOLE SODIUM 40 MG IV SOLR
40.0000 mg | Freq: Every day | INTRAVENOUS | Status: DC
  Administered 2019-06-05: 40 mg via INTRAVENOUS
  Filled 2019-06-05: qty 40

## 2019-06-05 MED ORDER — EPHEDRINE SULFATE 50 MG/ML IJ SOLN
INTRAMUSCULAR | Status: DC | PRN
  Administered 2019-06-05: 5 mg via INTRAVENOUS

## 2019-06-05 MED ORDER — ONDANSETRON HCL 4 MG PO TABS: 4.0000 mg | ORAL_TABLET | Freq: Four times a day (QID) | ORAL | Status: DC | PRN

## 2019-06-05 MED ORDER — PROMETHAZINE HCL 25 MG/ML IJ SOLN: 6.2500 mg | INTRAMUSCULAR | Status: DC | PRN

## 2019-06-05 MED ORDER — SIMETHICONE 80 MG PO CHEW: 80.0000 mg | CHEWABLE_TABLET | Freq: Three times a day (TID) | ORAL | Status: DC

## 2019-06-05 MED ORDER — GLYCOPYRROLATE 0.2 MG/ML IJ SOLN
INTRAMUSCULAR | Status: DC | PRN
  Administered 2019-06-05: 0.2 mg via INTRAVENOUS

## 2019-06-05 MED ORDER — POLYETHYLENE GLYCOL 3350 17 G PO PACK
17.0000 g | PACK | Freq: Every day | ORAL | Status: DC
  Administered 2019-06-06 – 2019-06-08 (×3): 17 g via ORAL
  Filled 2019-06-05 (×3): qty 1

## 2019-06-05 MED ORDER — SODIUM CHLORIDE 0.9% FLUSH: 9.0000 mL | INTRAVENOUS | Status: DC | PRN

## 2019-06-05 MED ORDER — DEXAMETHASONE SODIUM PHOSPHATE 10 MG/ML IJ SOLN
INTRAMUSCULAR | Status: DC | PRN
  Administered 2019-06-05: 10 mg via INTRAVENOUS

## 2019-06-05 SURGICAL SUPPLY — 69 items
ADH SKN CLS APL DERMABOND .7 (GAUZE/BANDAGES/DRESSINGS) ×4
APL SKNCLS STERI-STRIP NONHPOA (GAUZE/BANDAGES/DRESSINGS) ×4
BARRIER ADHS 3X4 INTERCEED (GAUZE/BANDAGES/DRESSINGS) IMPLANT
BENZOIN TINCTURE PRP APPL 2/3 (GAUZE/BANDAGES/DRESSINGS) ×1 IMPLANT
BLADE SURG 15 STRL LF DISP TIS (BLADE) ×4 IMPLANT
BLADE SURG 15 STRL SS (BLADE) ×5
BRR ADH 4X3 ABS CNTRL BYND (GAUZE/BANDAGES/DRESSINGS)
CABLE HIGH FREQUENCY MONO STRZ (ELECTRODE) ×5 IMPLANT
CANISTER SUCT 3000ML PPV (MISCELLANEOUS) ×5 IMPLANT
DECANTER SPIKE VIAL GLASS SM (MISCELLANEOUS) ×5 IMPLANT
DEFOGGER SCOPE WARMER CLEARIFY (MISCELLANEOUS) ×5 IMPLANT
DERMABOND ADVANCED (GAUZE/BANDAGES/DRESSINGS) ×1
DERMABOND ADVANCED .7 DNX12 (GAUZE/BANDAGES/DRESSINGS) ×4 IMPLANT
DEVICE SUTURE ENDOST 10MM (ENDOMECHANICALS) IMPLANT
DRSG OPSITE POSTOP 4X10 (GAUZE/BANDAGES/DRESSINGS) ×1 IMPLANT
DURAPREP 26ML APPLICATOR (WOUND CARE) ×5 IMPLANT
GLOVE BIOGEL PI IND STRL 7.0 (GLOVE) ×8 IMPLANT
GLOVE BIOGEL PI IND STRL 7.5 (GLOVE) ×8 IMPLANT
GLOVE BIOGEL PI INDICATOR 7.0 (GLOVE) ×2
GLOVE BIOGEL PI INDICATOR 7.5 (GLOVE) ×2
GLOVE SURG SS PI 7.0 STRL IVOR (GLOVE) ×20 IMPLANT
GOWN STRL REUS W/ TWL LRG LVL3 (GOWN DISPOSABLE) ×12 IMPLANT
GOWN STRL REUS W/ TWL XL LVL3 (GOWN DISPOSABLE) ×8 IMPLANT
GOWN STRL REUS W/TWL LRG LVL3 (GOWN DISPOSABLE) ×15
GOWN STRL REUS W/TWL XL LVL3 (GOWN DISPOSABLE) ×10
HIBICLENS CHG 4% 4OZ BTL (MISCELLANEOUS) ×9 IMPLANT
KIT TURNOVER KIT B (KITS) ×5 IMPLANT
LIGASURE VESSEL 5MM BLUNT TIP (ELECTROSURGICAL) ×5 IMPLANT
MANIPULATOR VCARE LG CRV RETR (MISCELLANEOUS) IMPLANT
MANIPULATOR VCARE SML CRV RETR (MISCELLANEOUS) IMPLANT
MANIPULATOR VCARE STD CRV RETR (MISCELLANEOUS) ×1 IMPLANT
OCCLUDER COLPOPNEUMO (BALLOONS) ×5 IMPLANT
PACK LAPAROSCOPY BASIN (CUSTOM PROCEDURE TRAY) ×5 IMPLANT
PACK TRENDGUARD 450 HYBRID PRO (MISCELLANEOUS) IMPLANT
PAD ARMBOARD 7.5X6 YLW CONV (MISCELLANEOUS) ×10 IMPLANT
PAD OB MATERNITY 4.3X12.25 (PERSONAL CARE ITEMS) ×5 IMPLANT
PENCIL BUTTON HOLSTER BLD 10FT (ELECTRODE) ×1 IMPLANT
POUCH LAPAROSCOPIC INSTRUMENT (MISCELLANEOUS) ×10 IMPLANT
SCISSORS LAP 5X35 DISP (ENDOMECHANICALS) ×5 IMPLANT
SET IRRIG TUBING LAPAROSCOPIC (IRRIGATION / IRRIGATOR) ×5 IMPLANT
SET IRRIG Y TYPE TUR BLADDER L (SET/KITS/TRAYS/PACK) IMPLANT
SET TUBE SMOKE EVAC HIGH FLOW (TUBING) ×5 IMPLANT
SOLUTION ELECTROLUBE (MISCELLANEOUS) IMPLANT
SPONGE LAP 18X18 RF (DISPOSABLE) ×4 IMPLANT
STRIP CLOSURE SKIN 1/2X4 (GAUZE/BANDAGES/DRESSINGS) ×1 IMPLANT
SUT ENDO VLOC 180-0-8IN (SUTURE) IMPLANT
SUT MNCRL AB 4-0 PS2 18 (SUTURE) ×10 IMPLANT
SUT MON AB 4-0 PS1 27 (SUTURE) ×1 IMPLANT
SUT VIC AB 0 CT1 18XCR BRD8 (SUTURE) IMPLANT
SUT VIC AB 0 CT1 36 (SUTURE) ×1 IMPLANT
SUT VIC AB 0 CT1 8-18 (SUTURE) ×10
SUT VIC AB 1 CT1 27 (SUTURE) ×10
SUT VIC AB 1 CT1 27XBRD ANTBC (SUTURE) IMPLANT
SUT VIC AB 2-0 CT1 27 (SUTURE) ×5
SUT VIC AB 2-0 CT1 TAPERPNT 27 (SUTURE) IMPLANT
SUT VIC AB 3-0 SH 27 (SUTURE) ×10
SUT VIC AB 3-0 SH 27XBRD (SUTURE) IMPLANT
SUT VICRYL 0 UR6 27IN ABS (SUTURE) ×10 IMPLANT
SYR 50ML LL SCALE MARK (SYRINGE) ×5 IMPLANT
SYSTEM CARTER THOMASON II (TROCAR) IMPLANT
TOWEL GREEN STERILE FF (TOWEL DISPOSABLE) ×10 IMPLANT
TRAY FOLEY W/BAG SLVR 14FR LF (SET/KITS/TRAYS/PACK) ×5 IMPLANT
TRENDGUARD 450 HYBRID PRO PACK (MISCELLANEOUS) ×5
TROCAR ADV FIXATION 5X100MM (TROCAR) ×10 IMPLANT
TROCAR BALLN 12MMX100 BLUNT (TROCAR) ×1 IMPLANT
TROCAR XCEL NON-BLD 11X100MML (ENDOMECHANICALS) ×5 IMPLANT
TUBE CONNECTING 12X1/4 (SUCTIONS) ×1 IMPLANT
UNDERPAD 30X30 (UNDERPADS AND DIAPERS) ×5 IMPLANT
YANKAUER SUCT BULB TIP NO VENT (SUCTIONS) ×1 IMPLANT

## 2019-06-05 NOTE — Op Note (Signed)
Operative Note   06/05/2019  PRE-OP DIAGNOSIS *Large fibroid uterus *Abnormal uterine bleeding *Anemia   POST-OP DIAGNOSIS *Same  *Likely fallopian tube endometriosis  SURGEON: Surgeon(s) and Role:    * Aletha Halim, MD - Primary    * Lavonia Drafts, MD - Assisting    * Sloan Leiter, MD - Assisting  PROCEDURE: Diagnostic laparoscopy, lysis of adhesions <30 minutes, total abdominal hysterectomy, left salpingectomy, cystoscopy.   ANESTHESIA: General and local  ESTIMATED BLOOD LOSS: 551mL  DRAINS: indwelling foley 729mL   TOTAL IV FLUIDS: 1353mL crystalloid, 760mL albumin  VTE PROPHYLAXIS: SCDs to the bilateral lower extremities  ANTIBIOTICS: Two grams of Cefazolin were given. within 1 hour of skin incision  SPECIMENS: cervix, uterus, left tube  DISPOSITION: PACU - hemodynamically stable.  CONDITION: stable  COMPLICATIONS: Large fibroid uterus at the bladder flap precluding laparoscopic approach  FINDINGS: large fibroid uterus, approximately 14 week sized, 4cm anterior subserosal fibroid at the medial-right bladder flap area; there were was also a large 7-8cm fundal, intramural fibroid and several other smaller intramural fibroids noted around the uterus. Surgically absent right tube and ovary. Left hydrosalpingx with brown/chocolate fluid. Normal appearing left ovary. Normal bladder wall on cystoscopy and +jets from bilateral ureteral orifices.  Normal stomach and liver edge. Omental adhesions from just below the umbilicus to the lower pelvis on the left side.   DESCRIPTION OF PROCEDURE: After informed consent was obtained, the patient was prepped and draped in the usual sterile manner for an abdominal procedure and placed in the dorsal supine position.  After injection of local anesthesia, using the open technique, the abdomen was entered at the umbilicus and a 12 mm balloon trocar placed. Next pneumoperitoneum was obtained and the laparoscope introduced.   After injection of local anesthesia, a 42mm port was placed in the RLQ under direct visualization.  Using the Ligasure, the omental adhesions were taken down.  The uterus was observed and decision made to convert to open procedure given the large anterior fibroid near the bladder flap/utero-cervical junction.    A Pfannenstiel skin incision was made and the bowel packed away.  The round ligaments on either side were identified and individually dissected and ligated with #0 Vicryl suture and divided.  The bilateral right tube and ovary were surgically absent.  The left uteroovarian ligament was grasped with kelly clamps and cut and tied, and the tissue taken down along with the bladder flap on this side with scissors.  This was continued on the right side where the fibroid is with the scissors  To below the fibroid.  The uterine arteries were then serially clamped and cut with heaney clamps and 0-vicryl.  Next, using cut current, a supracervical hysterectomy was done.  Using the ligasure the left tube was removed and using the Ligasure, the cervix was isolated and clamped below with curve clamps. Scissors were then used to remove the cervix.  The vaginal cuff was closed with 0-vicryl figure of eight sutures being careful to incorporate the edges into the utero-sacral ligaments. The entire was irrigated and hemostasis was then inspected and secured throughout the entire area.  The abdomen was covered and the cystoscopy was performed with the above noted findings  Next gloves donned and the umbilical fascia was closed with 0 vicryl in a figure of eight. Having removed all instruments and packs, we then began closure of the abdomen. The peritoneum was closed with 3-0 Vicryl in a running continuous manner. The fascia was closed with 0 Vicryl  in a running continuous manner and the subcutaneous tissue was also closed with 2-0 plain gut in a running continuous manner and the skin was closed with 4-0 monocryl. The RLQ  port site was closed with dermabond.   Hemostasis was secured throughout the entire layers. Instrument counts were correct x 2 and the patient was taken to the recovery room in good condition and breathing independently.  Durene Romans MD Attending [facility] for [employer] farm manager)

## 2019-06-05 NOTE — Anesthesia Procedure Notes (Signed)
Procedure Name: Intubation Date/Time: 06/05/2019 11:16 AM Performed by: Valda Favia, CRNA Pre-anesthesia Checklist: Patient identified, Emergency Drugs available, Suction available, Timeout performed and Patient being monitored Patient Re-evaluated:Patient Re-evaluated prior to induction Oxygen Delivery Method: Circle system utilized Preoxygenation: Pre-oxygenation with 100% oxygen Induction Type: IV induction Ventilation: Mask ventilation without difficulty and Oral airway inserted - appropriate to patient size Laryngoscope Size: Mac and 4 Grade View: Grade I Tube type: Oral Tube size: 7.0 mm Number of attempts: 1 Airway Equipment and Method: Stylet Placement Confirmation: ETT inserted through vocal cords under direct vision,  positive ETCO2 and breath sounds checked- equal and bilateral Secured at: 21 cm Tube secured with: Tape Dental Injury: Teeth and Oropharynx as per pre-operative assessment

## 2019-06-05 NOTE — Brief Op Note (Signed)
06/05/2019  2:41 PM  PATIENT:  Jodi Mendez  43 y.o. female  PRE-OPERATIVE DIAGNOSIS:  FIBROID AUB  POST-OPERATIVE DIAGNOSIS:  FIBROID AUB  PROCEDURE:  Procedure(s): LAPAROSCOPY DIAGNOSTIC (N/A) HYSTERECTOMY ABDOMINAL WITH LEFT SALPINGECTOMY (Left) Cystoscopy (N/A)  SURGEON:  Surgeon(s) and Role:    * Aletha Halim, MD - Primary    * Lavonia Drafts, MD - Assisting    * Sloan Leiter, MD - Assisting   ANESTHESIA:   general  EBL:  500 mL   BLOOD ADMINISTERED:none  DRAINS: indwelling foley  LOCAL MEDICATIONS USED:  MARCAINE     SPECIMEN:  Cervix uterus, left tube  DISPOSITION OF SPECIMEN:  PATHOLOGY  COUNTS:  YES  TOURNIQUET:  * No tourniquets in log *  DICTATION: .Note written in EPIC  PLAN OF CARE: Admit to inpatient   PATIENT DISPOSITION:  PACU - hemodynamically stable.   Delay start of Pharmacological VTE agent (>24hrs) due to surgical blood loss or risk of bleeding: yes  Durene Romans MD Attending [facility]) 06/05/2019 Time: [phone]

## 2019-06-05 NOTE — Transfer of Care (Signed)
Immediate Anesthesia Transfer of Care Note  Patient: Jodi Mendez  Procedure(s) Performed: LAPAROSCOPY DIAGNOSTIC (N/A Abdomen) HYSTERECTOMY ABDOMINAL WITH LEFT SALPINGECTOMY (Left Abdomen) Cystoscopy (N/A Bladder)  Patient Location: PACU  Anesthesia Type:General  Level of Consciousness: drowsy  Airway & Oxygen Therapy: Patient Spontanous Breathing and Patient connected to face mask oxygen  Post-op Assessment: Report given to RN and Post -op Vital signs reviewed and stable  Post vital signs: Reviewed and stable  Last Vitals:  Vitals Value Taken Time  BP 120/67 06/05/19 1447  Temp 36.9 C 06/05/19 1447  Pulse 73 06/05/19 1452  Resp 19 06/05/19 1452  SpO2 100 % 06/05/19 1452  Vitals shown include unvalidated device data.  Last Pain:  Vitals:   06/05/19 0902  TempSrc:   PainSc: 0-No pain         Complications: No apparent anesthesia complications

## 2019-06-05 NOTE — H&P (Signed)
Obstetrics & Gynecology Surgical H&P   Date of Surgery: 06/05/2019   Primary OBGYN: [facility] Primary Care Provider: [facility] at [facility] Lavell Anchors, Clarissa)  Reason for Admission: scheduled hysterectomy  History of Present Illness: Jodi Mendez is a 43 y.o. G1P0010 (No LMP recorded. (Menstrual status: Irregular Periods).), with the above CC. PMHx is significant for fibroids, anemia, HTN.    Patient most recently admitted last week for pre-op labs showing anemia. She was transfused 3U and started on Lysteda. She currently has no bleeding or pain.   Prior to this, we had tried hysteroscopy, myomectomy with some relief but AUB, anemia recurred.  ROS: A 12-point review of systems was performed and negative, except as stated in the above HPI.  OBGYN History: As per HPI. OB History  Gravida Para Term Preterm AB Living  1 0     1    SAB TAB Ectopic Multiple Live Births      1        # Outcome Date GA Lbr Len/2nd Weight Sex Delivery Anes PTL Lv  1 Ectopic               Past Medical History: Past Medical History:  Diagnosis Date  . Anemia   . Hypertension 11/06/2018    Past Surgical History: Past Surgical History:  Procedure Laterality Date  . DILATATION & CURETTAGE/HYSTEROSCOPY WITH MYOSURE N/A 01/31/2019   Procedure: DILATATION & CURETTAGE/HYSTEROSCOPY WITH MYOSURE AND MYOMECTOMY;  Surgeon: Aletha Halim, MD;  Location: [employer];  Service: Gynecology;  Laterality: N/A;  . UNILATERAL SALPINGECTOMY Left 2007   Ectopic pregnancy. suprapubic mini-lap    Family History:  Family History  Problem Relation Age of Onset  . Diabetes Mother   . Hypertension Mother     Social History:  Social History   Socioeconomic History  . Marital status: Divorced    Spouse name: Not on file  . Number of children: 0  . Years of education: Not on file  . Highest education level: Some college, no degree  Occupational History  . Not on file   Social Needs  . Financial resource strain: Not on file  . Food insecurity    Worry: Not on file    Inability: Not on file  . Transportation needs    Medical: Yes    Non-medical: Yes  Tobacco Use  . Smoking status: Never Smoker  . Smokeless tobacco: Never Used  Substance and Sexual Activity  . Alcohol use: Yes    Comment: social  . Drug use: No    Comment: "edible a time or two, nothing current"  . Sexual activity: Not Currently    Comment: not within the last couple of months/sexual active with women  Lifestyle  . Physical activity    Days per week: Not on file    Minutes per session: Not on file  . Stress: Not on file  Relationships  . Social Herbalist on phone: Not on file    Gets together: Not on file    Attends religious service: Not on file    Active member of club or organization: Not on file    Attends meetings of clubs or organizations: Not on file    Relationship status: Not on file  . Intimate partner violence    Fear of current or ex partner: Not on file    Emotionally abused: Not on file    Physically abused: Not on file  Forced sexual activity: Not on file  Other Topics Concern  . Not on file  Social History Narrative  . Not on file     Allergy: Allergies  Allergen Reactions  . Sulfa Antibiotics Hives and Itching    Current Outpatient Medications: Medications Prior to Admission  Medication Sig Dispense Refill Last Dose  . amLODipine (NORVASC) 5 MG tablet Take 2 tablets (10 mg total) by mouth daily. 60 tablet 2   . ferrous gluconate (FERGON) 324 MG tablet Take 1 tablet (324 mg total) by mouth 2 (two) times daily with a meal. (Patient taking differently: Take 324 mg by mouth See admin instructions. Twice daily during heavy bleeding) 60 tablet 1   . senna (SENNA-LAX) 8.6 MG tablet Take 1 tablet by mouth daily as needed for constipation.         Hospital Medications: Current Facility-Administered Medications  Medication Dose Route  Frequency Provider Last Rate Last Dose  . ceFAZolin (ANCEF) IVPB 2g/100 mL premix  2 g Intravenous On Call to OR Aletha Halim, MD      . lactated ringers infusion   Intravenous Continuous Aletha Halim, MD         Physical Exam:   Current Vital Signs 24h Vital Sign Ranges  T 98.8 F (37.1 C) Temp  Avg: 98.8 F (37.1 C)  Min: 98.8 F (37.1 C)  Max: 98.8 F (37.1 C)  BP (!) 166/91 BP  Min: 166/91  Max: 166/91  HR (!) 59 Pulse  Avg: 59  Min: 59  Max: 59  RR 18 Resp  Avg: 18  Min: 18  Max: 18  SaO2 100 % Room Air SpO2  Avg: 100 %  Min: 100 %  Max: 100 %       24 Hour I/O Current Shift I/O  Time Ins Outs No intake/output data recorded. No intake/output data recorded.    There is no height or weight on file to calculate BMI. General appearance: Well nourished, well developed female in no acute distress.  Cardiovascular: S1, S2 normal, no murmur, rub or gallop, regular rate and rhythm Respiratory:  Clear to auscultation bilateral. Normal respiratory effort Abdomen: positive bowel sounds and no masses, hernias; diffusely non tender to palpation, non distended Neuro/Psych:  Normal mood and affect.  Skin:  Warm and dry.  Extremities: no clubbing, cyanosis, or edema.    Laboratory: Recent Labs  Lab 05/30/19 0932 05/30/19 1747 05/31/19 0840  WBC 3.7* 3.8*  --   HGB 5.5* 5.9* 10.0*  HCT 21.1* 23.1* 32.6*  PLT 383 362  --    Recent Labs  Lab 05/30/19 0932  NA 137  K 3.6  CL 107  CO2 22  BUN 8  CREATININE 0.65  CALCIUM 8.9  PROT 8.1  BILITOT 0.6  ALKPHOS 79  ALT 16  AST 20  GLUCOSE 110*    Recent Labs  Lab 05/30/19 1745  ABORH O POS    Imaging:  CLINICAL DATA:  Follow-up complex LEFT ovarian cyst  EXAM: ULTRASOUND PELVIS TRANSVAGINAL  TECHNIQUE: Transvaginal ultrasound examination of the pelvis was performed including evaluation of the uterus, ovaries, adnexal regions, and pelvic cul-de-sac.  COMPARISON:   10/05/2018  FINDINGS: Uterus  Measurements: 13.5 x 8.1 x 7.6 cm = volume: 434 mL. Anteverted. Heterogeneous echogenicity. Large central mass within the uterus 4.5 x 4.0 x 4.1 cm compatible with leiomyoma. This extend submucosal and distorts the endometrial complex. Probable additional small uterine leiomyomata.  Endometrium  Thickness: 13 mm. Poorly visualized due  to distortion by large central uterine mass period  Right ovary  Measurements: 3.2 x 1.5 x 1.5 cm = volume: 3.7 mL. Normal morphology without mass  Left ovary  Measurements: 3.2 x 2.1 x 2.2 cm = volume: 7.7 mL. Hypoechoic nodule 1.9 x 1.2 x 1.2 cm containing scattered internal echogenicity question small hemorrhagic cyst; this has decreased in size since the prior exam when it measured 3.6 x 3.1 x 2.2 cm. No additional masses.  Other findings:  Trace free pelvic fluid.  IMPRESSION: Enlarged uterus containing leiomyomata including a 4.5 cm diameter central submucosal leiomyoma.  Interval decrease in size of hypoechoic complicated cyst within LEFT ovary likely representing an evolving hemorrhagic cyst.  No new intrapelvic abnormalities.   Electronically Signed   By: Lavonia Dana M.D.   On: 10/19/2018 13:16  Assessment: pt doing well  Plan: D/w pt and amenable with proceeding with TLH/bilateral salpingectomy/cysto  Durene Romans MD Attending [facility]) [phone]

## 2019-06-05 NOTE — Anesthesia Postprocedure Evaluation (Signed)
Anesthesia Post Note  Patient: Jodi Mendez  Procedure(s) Performed: LAPAROSCOPY DIAGNOSTIC (N/A Abdomen) HYSTERECTOMY ABDOMINAL WITH LEFT SALPINGECTOMY (Left Abdomen) Cystoscopy (N/A Bladder)     Patient location during evaluation: PACU Anesthesia Type: General Level of consciousness: awake and alert Pain management: pain level controlled Vital Signs Assessment: post-procedure vital signs reviewed and stable Respiratory status: spontaneous breathing, nonlabored ventilation, respiratory function stable and patient connected to nasal cannula oxygen Cardiovascular status: blood pressure returned to baseline and stable Postop Assessment: no apparent nausea or vomiting Anesthetic complications: no    Last Vitals:  Vitals:   06/05/19 1631 06/05/19 1707  BP: 111/65 109/61  Pulse: 65 66  Resp: 18 18  Temp: 36.6 C 36.5 C  SpO2: 100% 99%    Last Pain:  Vitals:   06/05/19 1707  TempSrc: Oral  PainSc:                  Sharin Altidor P Jakala Herford

## 2019-06-06 ENCOUNTER — Encounter: Payer: Self-pay | Admitting: Obstetrics and Gynecology

## 2019-06-06 ENCOUNTER — Encounter (HOSPITAL_COMMUNITY): Payer: Self-pay | Admitting: Obstetrics and Gynecology

## 2019-06-06 DIAGNOSIS — N809 Endometriosis, unspecified: Secondary | ICD-10-CM | POA: Insufficient documentation

## 2019-06-06 LAB — BASIC METABOLIC PANEL
Anion gap: 10 (ref 5–15)
BUN: 9 mg/dL (ref 6–20)
CO2: 20 mmol/L — ABNORMAL LOW (ref 22–32)
Calcium: 9 mg/dL (ref 8.9–10.3)
Chloride: 104 mmol/L (ref 98–111)
Creatinine, Ser: 0.78 mg/dL (ref 0.44–1.00)
GFR calc Af Amer: >60 mL/min (ref >60)
GFR calc non Af Amer: >60 mL/min (ref >60)
Glucose, Bld: 167 mg/dL — ABNORMAL HIGH (ref 70–99)
Potassium: 4.2 mmol/L (ref 3.5–5.1)
Sodium: 134 mmol/L — ABNORMAL LOW (ref 135–145)

## 2019-06-06 LAB — CBC
HCT: 30.4 % — ABNORMAL LOW (ref 36.0–46.0)
Hemoglobin: 9.1 g/dL — ABNORMAL LOW (ref 12.0–15.0)
MCH: 22.6 pg — ABNORMAL LOW (ref 26.0–34.0)
MCHC: 29.9 g/dL — ABNORMAL LOW (ref 30.0–36.0)
MCV: 75.6 fL — ABNORMAL LOW (ref 80.0–100.0)
Platelets: 236 10*3/uL (ref 150–400)
RBC: 4.02 MIL/uL (ref 3.87–5.11)
WBC: 11.3 10*3/uL — ABNORMAL HIGH (ref 4.0–10.5)
nRBC: 0 % (ref 0.0–0.2)

## 2019-06-06 LAB — SURGICAL PATHOLOGY

## 2019-06-06 MED ORDER — OXYCODONE HCL 5 MG PO TABS
5.0000 mg | ORAL_TABLET | Freq: Four times a day (QID) | ORAL | Status: DC | PRN
  Administered 2019-06-06: 10 mg via ORAL
  Administered 2019-06-06: 5 mg via ORAL
  Administered 2019-06-06 – 2019-06-07 (×2): 10 mg via ORAL
  Filled 2019-06-06 (×4): qty 2

## 2019-06-06 MED ORDER — CHLORHEXIDINE GLUCONATE CLOTH 2 % EX PADS
6.0000 | MEDICATED_PAD | Freq: Every day | CUTANEOUS | Status: DC
  Administered 2019-06-06 – 2019-06-08 (×3): 6 via TOPICAL

## 2019-06-06 MED ORDER — PANTOPRAZOLE SODIUM 40 MG PO TBEC
40.0000 mg | DELAYED_RELEASE_TABLET | Freq: Every day | ORAL | Status: DC
  Administered 2019-06-06 – 2019-06-07 (×2): 40 mg via ORAL
  Filled 2019-06-06 (×2): qty 1

## 2019-06-06 MED ORDER — DIPHENHYDRAMINE HCL 25 MG PO CAPS
25.0000 mg | ORAL_CAPSULE | Freq: Four times a day (QID) | ORAL | Status: DC | PRN
  Administered 2019-06-06 (×2): 25 mg via ORAL
  Filled 2019-06-06 (×2): qty 1

## 2019-06-06 MED ORDER — SENNA 8.6 MG PO TABS
1.0000 | ORAL_TABLET | Freq: Every day | ORAL | Status: AC
  Administered 2019-06-06: 8.6 mg via ORAL
  Filled 2019-06-06: qty 1

## 2019-06-06 MED ORDER — ACETAMINOPHEN 325 MG PO TABS
650.0000 mg | ORAL_TABLET | Freq: Four times a day (QID) | ORAL | Status: DC
  Administered 2019-06-06 – 2019-06-08 (×7): 650 mg via ORAL
  Filled 2019-06-06 (×8): qty 2

## 2019-06-06 MED ORDER — LIDOCAINE 5 % EX PTCH
1.0000 | MEDICATED_PATCH | CUTANEOUS | Status: AC
  Administered 2019-06-06: 1 via TRANSDERMAL
  Filled 2019-06-06: qty 1

## 2019-06-06 NOTE — Progress Notes (Signed)
Gynecology Progress Note  Admission Date: 06/05/2019 Current Date: 06/06/2019 7:50 AM  Jodi Mendez is a 43 y.o. G1P0010 POD#1 s/p diagnostic laparoscopy converted to TAH/left salpingectomy/cysto (EBL 57mL) for fibroids, abnormal bleeding.    History complicated by: Patient Active Problem List   Diagnosis Date Noted  . Abnormal uterine bleeding (AUB) 06/05/2019  . Status post hysterectomy 06/05/2019  . Anemia 05/30/2019  . Hypertension 11/06/2018  . Chronic anemia 10/05/2018  . Menorrhagia 02/09/2013  . Leiomyoma of uterus 02/09/2013    ROS and patient/family/surgical history, located on admission H&P note dated 06/05/2019, have been reviewed, and there are no changes except as noted below Yesterday/Overnight Events:  none  Subjective:  Taking some PO liquids, didn't really need PCA; no chest pain, sob, fevers, chills, nausea, vomiting, ambulation, flatus  Objective:    Current Vital Signs 24h Vital Sign Ranges  T 98.4 F (36.9 C) Temp  Avg: 98.1 F (36.7 C)  Min: 97.6 F (36.4 C)  Max: 98.8 F (37.1 C)  BP 114/63 BP  Min: 106/63  Max: 166/91  HR 61 Pulse  Avg: 68.2  Min: 59  Max: 73  RR 18 Resp  Avg: 18.3  Min: 14  Max: 21  SaO2 99 % Room Air SpO2  Avg: 99.7 %  Min: 99 %  Max: 100 %       24 Hour I/O Current Shift I/O  Time Ins Outs 10/06 0701 - 10/07 0700 In: 2931 [I.V.:2681] Out: 2300 [Urine:1800] No intake/output data recorded.   Patient Vitals for the past 24 hrs:  BP Temp Temp src Pulse Resp SpO2 Height Weight  06/06/19 0525 114/63 98.4 F (36.9 C) Oral 61 18 99 % - -  06/06/19 0500 - - - - 15 99 % - -  06/06/19 0106 - - - - 14 100 % - -  06/05/19 2053 - - - - 16 99 % - -  06/05/19 2049 125/76 97.7 F (36.5 C) Oral 72 (!) 21 - - -  06/05/19 1707 109/61 97.7 F (36.5 C) Oral 66 18 99 % - -  06/05/19 1631 111/65 97.8 F (36.6 C) - 65 18 100 % - -  06/05/19 1616 106/63 - - 73 18 100 % - -  06/05/19 1546 111/64 97.6 F (36.4 C) - 72 19 100 % - -   06/05/19 1531 109/62 - - 69 (!) 21 100 % - -  06/05/19 1516 114/66 - - 72 20 100 % - -  06/05/19 1501 114/64 - - 73 20 100 % - -  06/05/19 1500 - - - - 20 100 % - -  06/05/19 1447 - 98.5 F (36.9 C) - - - - - -  06/05/19 0902 - - - - - - 5' 4.5" (1.638 m) 72.2 kg  06/05/19 0822 (!) 166/91 98.8 F (37.1 C) Oral (!) 59 18 100 % - -  UOP: >154mL/hr  Physical exam: General appearance: alert, cooperative and appears stated age Abdomen: c/d/i dressing x2 and l/s port site. Soft, nttp, mildly distended, no BS GU: No gross VB, normal appearing UOP in foley (approximately 261mL in bag) Lungs: clear to auscultation bilaterally Heart: S1, S2 normal, no murmur, rub or gallop, regular rate and rhythm Extremities: SCDs on Skin: warm and dry Psych: appropriate Neurologic: Grossly normal  Medications Current Facility-Administered Medications  Medication Dose Route Frequency Provider Last Rate Last Dose  . acetaminophen (TYLENOL) tablet 650 mg  650 mg Oral Q6H Aletha Halim, MD  650 mg at 06/06/19 0745  . amLODipine (NORVASC) tablet 10 mg  10 mg Oral Daily Aletha Halim, MD      . Chlorhexidine Gluconate Cloth 2 % PADS 6 each  6 each Topical Q0600 Aletha Halim, MD   6 each at 06/06/19 0600  . lactated ringers infusion   Intravenous Continuous Aletha Halim, MD 100 mL/hr at 06/06/19 0649    . lidocaine (LIDODERM) 5 % 1 patch  1 patch Transdermal Q24H Aletha Halim, MD   1 patch at 06/06/19 0745  . ondansetron (ZOFRAN) tablet 4 mg  4 mg Oral Q6H PRN Aletha Halim, MD       Or  . ondansetron (ZOFRAN) injection 4 mg  4 mg Intravenous Q6H PRN Aletha Halim, MD      . oxyCODONE (Oxy IR/ROXICODONE) immediate release tablet 5-10 mg  5-10 mg Oral Q6H PRN Aletha Halim, MD      . pantoprazole (PROTONIX) injection 40 mg  40 mg Intravenous QHS Aletha Halim, MD   40 mg at 06/05/19 2055  . polyethylene glycol (MIRALAX / GLYCOLAX) packet 17 g  17 g Oral Daily Aletha Halim, MD       . senna (SENOKOT) tablet 8.6 mg  1 tablet Oral QHS Aletha Halim, MD          Labs  Recent Labs  Lab 05/30/19 1747 05/31/19 0840 06/05/19 0919 06/06/19 0232  WBC 3.8*  --  4.7 11.3*  HGB 5.9* 10.0* 11.0* 9.1*  HCT 23.1* 32.6* 36.2 30.4*  PLT 362  --  214 236    Recent Labs  Lab 05/30/19 0932 06/06/19 0232  NA 137 134*  K 3.6 4.2  CL 107 104  CO2 22 20*  BUN 8 9  CREATININE 0.65 0.78  CALCIUM 8.9 9.0  PROT 8.1  --   BILITOT 0.6  --   ALKPHOS 79  --   ALT 16  --   AST 20  --   GLUCOSE 110* 167*    Radiology none  Assessment & Plan:  Pt doing well *GYN: follow up final pathology. D/c foley and pca. Advance diet to softs *HTN: continue home norvasc *Pain: d/c pca for PO PRNs *FEN/GI: transition to soft. Can SLIV when taking more PO and voids *PPx: SCDs, OOB today *Dispo: possibly tomorrow.   Code Status: Full Code  Durene Romans MD Attending Center for [employer] (Faculty Practice) [phone]

## 2019-06-06 NOTE — TOC Progression Note (Addendum)
Transition of Care [facility]) - Progression Note    Patient Details  Name: Jodi Mendez MRN: [id number] Date of Birth: June 11, 1976  [facility] CM/SW Contact  Jacalyn Lefevre Edson Snowball, RN Phone Number: 06/06/2019, 8:54 AM  Clinical Narrative:     Patient recently discharged. Lives at home with mother. Was given [employer] [facility] information last visit and is in process of completing paperwork.   Patient does want to use TOC pharmacy at discharged, changed in system. Patient just used [facility] on 05/31/19 so is not eligible for MATCH at this time ( once a year), patient aware.   Expected Discharge Plan: Home/Self Care Barriers to Discharge: Continued Medical Work up  Expected Discharge Plan and Services Expected Discharge Plan: Home/Self Care In-house Referral: Financial Counselor Discharge Planning Services: [facility]   Living arrangements for the past 2 months: Apartment                 DME Arranged: N/A         HH Arranged: NA           Social Determinants of Health (SDOH) Interventions    Readmission Risk Interventions No flowsheet data found.

## 2019-06-07 MED ORDER — BISACODYL 5 MG PO TBEC
5.0000 mg | DELAYED_RELEASE_TABLET | Freq: Once | ORAL | Status: AC
  Administered 2019-06-07: 13:00:00 5 mg via ORAL
  Filled 2019-06-07: qty 1

## 2019-06-07 MED ORDER — METOCLOPRAMIDE HCL 5 MG/ML IJ SOLN
10.0000 mg | Freq: Four times a day (QID) | INTRAMUSCULAR | Status: DC | PRN
  Administered 2019-06-07: 10 mg via INTRAVENOUS
  Filled 2019-06-07: qty 2

## 2019-06-07 NOTE — Progress Notes (Signed)
Entered patient room to find patient had vomited a moderate amount of emesis. Patient states she saw food from her lunch meal yesterday in it. Patient cleaned and notified MD. New orders received. Patient sitting in bed at present. Will continue to monitor for remainder of shift.

## 2019-06-07 NOTE — Progress Notes (Addendum)
Gynecology Progress Note  Admission Date: 06/05/2019 Current Date: 06/07/2019 12:02 PM  Jodi Mendez is a 43 y.o. G1P0010 POD#2 s/p diagnostic laparoscopy converted to TAH/left salpingectomy/cysto (EBL 560mL) for fibroids, abnormal bleeding.    History complicated by: Patient Active Problem List   Diagnosis Date Noted  . Endometriosis 06/06/2019  . Abnormal uterine bleeding (AUB) 06/05/2019  . Status post hysterectomy 06/05/2019  . Anemia 05/30/2019  . Hypertension 11/06/2018  . Chronic anemia 10/05/2018  . Menorrhagia 02/09/2013  . Leiomyoma of uterus 02/09/2013    ROS and patient/family/surgical history, located on admission H&P note dated 06/05/2019, have been reviewed, and there are no changes except as noted below Yesterday/Overnight Events:  none  Subjective:  Meeting all post op goals but no flatus yet.   Objective:    Current Vital Signs 24h Vital Sign Ranges  T 98.2 F (36.8 C) Temp  Avg: 98.8 F (37.1 C)  Min: 98.2 F (36.8 C)  Max: 99.4 F (37.4 C)  BP 129/79 BP  Min: 129/79  Max: 145/79  HR 72 Pulse  Avg: 69.3  Min: 64  Max: 72  RR 14 Resp  Avg: 14.3  Min: 14  Max: 15  SaO2 100 % Room Air SpO2  Avg: 100 %  Min: 100 %  Max: 100 %       24 Hour I/O Current Shift I/O  Time Ins Outs 10/07 0701 - 10/08 0700 In: 1006.7 [I.V.:1006.7] Out: 200 [Urine:200] 10/08 0701 - 10/08 1900 In: 240 [P.O.:240] Out: -    Patient Vitals for the past 24 hrs:  BP Temp Temp src Pulse Resp SpO2  06/07/19 0420 129/79 98.2 F (36.8 C) Oral 72 14 100 %  06/06/19 2128 132/79 98.8 F (37.1 C) Oral 64 14 100 %  06/06/19 1431 (!) 145/79 99.4 F (37.4 C) Oral 72 15 100 %    Physical exam: General appearance: alert, cooperative and appears stated age Abdomen: c/d/i dressing x2 and l/s port site. Soft, nttp, mildly distended, +BS GU: No gross VB Lungs: clear to auscultation bilaterally Heart: S1, S2 normal, no murmur, rub or gallop, regular rate and rhythm Skin: warm and  dry Psych: appropriate Neurologic: Grossly normal  Medications Current Facility-Administered Medications  Medication Dose Route Frequency Provider Last Rate Last Dose  . acetaminophen (TYLENOL) tablet 650 mg  650 mg Oral Q6H Aletha Halim, MD   650 mg at 06/07/19 0540  . amLODipine (NORVASC) tablet 10 mg  10 mg Oral Daily Aletha Halim, MD   10 mg at 06/07/19 0958  . bisacodyl (DULCOLAX) EC tablet 5 mg  5 mg Oral Once Aletha Halim, MD      . Chlorhexidine Gluconate Cloth 2 % PADS 6 each  6 each Topical [id number] Aletha Halim, MD   6 each at 06/07/19 0539  . diphenhydrAMINE (BENADRYL) capsule 25 mg  25 mg Oral Q6H PRN Aletha Halim, MD   25 mg at 06/06/19 2241  . ondansetron (ZOFRAN) tablet 4 mg  4 mg Oral Q6H PRN Aletha Halim, MD       Or  . ondansetron (ZOFRAN) injection 4 mg  4 mg Intravenous Q6H PRN Aletha Halim, MD      . oxyCODONE (Oxy IR/ROXICODONE) immediate release tablet 5-10 mg  5-10 mg Oral Q6H PRN Aletha Halim, MD   10 mg at 06/07/19 0539  . pantoprazole (PROTONIX) EC tablet 40 mg  40 mg Oral QHS Pierce, Dwayne A, RPH   40 mg at 06/06/19 2133  .  polyethylene glycol (MIRALAX / GLYCOLAX) packet 17 g  17 g Oral Daily Aletha Halim, MD   17 g at 06/07/19 [id number]      Labs  No new labs  Radiology none  Assessment & Plan:  Pt doing well *GYN: final path benign. Try dulcolax. If can pass flatus then okay for d/c to home which patient desires.  *HTN: continue home norvasc *Pain:  PO PRNs *FEN/GI: soft diet, sliv *PPx: SCDs, OOB today *Dispo: possibly later today.   Code Status: Full Code  Durene Romans MD Attending Center for [employer] (Faculty Practice) [phone]

## 2019-06-08 ENCOUNTER — Encounter (HOSPITAL_COMMUNITY): Payer: Self-pay

## 2019-06-08 LAB — CBC
HCT: 32.7 % — ABNORMAL LOW (ref 36.0–46.0)
Hemoglobin: 9.8 g/dL — ABNORMAL LOW (ref 12.0–15.0)
MCH: 22.8 pg — ABNORMAL LOW (ref 26.0–34.0)
MCHC: 30 g/dL (ref 30.0–36.0)
MCV: 76.2 fL — ABNORMAL LOW (ref 80.0–100.0)
Platelets: 261 10*3/uL (ref 150–400)
RBC: 4.29 MIL/uL (ref 3.87–5.11)
WBC: 5.4 10*3/uL (ref 4.0–10.5)
nRBC: 0 % (ref 0.0–0.2)

## 2019-06-08 LAB — BASIC METABOLIC PANEL
Anion gap: 11 (ref 5–15)
BUN: 6 mg/dL (ref 6–20)
CO2: 24 mmol/L (ref 22–32)
Calcium: 9 mg/dL (ref 8.9–10.3)
Chloride: 102 mmol/L (ref 98–111)
Creatinine, Ser: 0.69 mg/dL (ref 0.44–1.00)
GFR calc Af Amer: >60 mL/min (ref >60)
GFR calc non Af Amer: >60 mL/min (ref >60)
Glucose, Bld: 92 mg/dL (ref 70–99)
Potassium: 3.9 mmol/L (ref 3.5–5.1)
Sodium: 137 mmol/L (ref 135–145)

## 2019-06-08 LAB — MAGNESIUM: Magnesium: 2.1 mg/dL (ref 1.7–2.4)

## 2019-06-08 MED ORDER — FERROUS GLUCONATE 324 (38 FE) MG PO TABS: 324.0000 mg | ORAL_TABLET | Freq: Every day | ORAL | 0 refills | Status: DC

## 2019-06-08 MED ORDER — POLYETHYLENE GLYCOL 3350 17 G PO PACK: 17.0000 g | PACK | Freq: Every day | ORAL | 0 refills | Status: DC

## 2019-06-08 MED ORDER — OXYCODONE-ACETAMINOPHEN 5-325 MG PO TABS: 1.0000 | ORAL_TABLET | ORAL | 0 refills | Status: DC | PRN

## 2019-06-08 MED ORDER — BISACODYL 10 MG RE SUPP
10.0000 mg | Freq: Once | RECTAL | Status: AC
  Administered 2019-06-08: 10 mg via RECTAL
  Filled 2019-06-08: qty 1

## 2019-06-08 MED FILL — OXYCODONE-ACETAMINOPHEN 5-3: 5-325 | 3 days supply | Qty: 35 | Fill #0

## 2019-06-08 MED FILL — POLYETHYLENE GLYCOL 3350 PO: 17 | 30 days supply | Qty: 510 | Fill #0

## 2019-06-08 NOTE — Progress Notes (Signed)
Pt given discharge instructions no questions at this time, pt wheeled to exit and ride picked pt up.

## 2019-06-08 NOTE — Progress Notes (Addendum)
Gynecology Progress Note  Admission Date: 06/05/2019 Current Date: 06/08/2019 7:24 AM  Jodi Mendez is a 43 y.o. G1P0010 POD# s/p diagnostic laparoscopy converted to TAH/left salpingectomy/cysto (EBL 57mL) for fibroids, abnormal bleeding.    History complicated by: Patient Active Problem List   Diagnosis Date Noted  . Endometriosis 06/06/2019  . Abnormal uterine bleeding (AUB) 06/05/2019  . Status post hysterectomy 06/05/2019  . Anemia 05/30/2019  . Hypertension 11/06/2018  . Chronic anemia 10/05/2018  . Menorrhagia 02/09/2013  . Leiomyoma of uterus 02/09/2013    ROS and patient/family/surgical history, located on admission H&P note dated 06/05/2019, have been reviewed, and there are no changes except as noted below Yesterday/Overnight Events:  Threw up yesterday afternoon but patient thinks it's because she ate too much  Subjective:  Meeting all post op goals but no flatus yet, including no n/v and patient ate dinner fine and slept fine last night.   Objective:    Current Vital Signs 24h Vital Sign Ranges  T 98.9 F (37.2 C) Temp  Avg: 98.5 F (36.9 C)  Min: 98.2 F (36.8 C)  Max: 98.9 F (37.2 C)  BP (!) 126/93 BP  Min: 126/93  Max: 150/98  HR 82 Pulse  Avg: 85.7  Min: 82  Max: 91  RR 17 Resp  Avg: 17  Min: 16  Max: 18  SaO2 100 % Room Air SpO2  Avg: 100 %  Min: 100 %  Max: 100 %       24 Hour I/O Current Shift I/O  Time Ins Outs 10/08 0701 - 10/09 0700 In: 840 [P.O.:840] Out: -  No intake/output data recorded.   Patient Vitals for the past 24 hrs:  BP Temp Temp src Pulse Resp SpO2  06/08/19 0539 (!) 126/93 98.9 F (37.2 C) Oral 82 17 100 %  06/07/19 2155 134/84 98.5 F (36.9 C) Oral 91 16 100 %  06/07/19 1429 (!) 150/98 98.2 F (36.8 C) Oral 84 18 100 %    Physical exam: General appearance: alert, cooperative and appears stated age Abdomen: c/d/i dressing x2 and l/s port site. Soft, nttp, mild to moderately distended, +BS Lungs: clear to auscultation  bilaterally Heart: S1, S2 normal, no murmur, rub or gallop, regular rate and rhythm Skin: warm and dry Psych: appropriate Neurologic: Grossly normal  Medications Current Facility-Administered Medications  Medication Dose Route Frequency Provider Last Rate Last Dose  . acetaminophen (TYLENOL) tablet 650 mg  650 mg Oral Q6H Aletha Halim, MD   650 mg at 06/08/19 0518  . amLODipine (NORVASC) tablet 10 mg  10 mg Oral Daily Aletha Halim, MD   10 mg at 06/07/19 0958  . bisacodyl (DULCOLAX) suppository 10 mg  10 mg Rectal Once Aletha Halim, MD      . Chlorhexidine Gluconate Cloth 2 % PADS 6 each  6 each Topical [id number] Aletha Halim, MD   6 each at 06/08/19 0519  . diphenhydrAMINE (BENADRYL) capsule 25 mg  25 mg Oral Q6H PRN Aletha Halim, MD   25 mg at 06/06/19 2241  . metoCLOPramide (REGLAN) injection 10 mg  10 mg Intravenous Q6H PRN Aletha Halim, MD   10 mg at 06/07/19 1814  . ondansetron (ZOFRAN) tablet 4 mg  4 mg Oral Q6H PRN Aletha Halim, MD       Or  . ondansetron (ZOFRAN) injection 4 mg  4 mg Intravenous Q6H PRN Aletha Halim, MD   4 mg at 06/07/19 1502  . oxyCODONE (Oxy IR/ROXICODONE) immediate release tablet 5-10  mg  5-10 mg Oral Q6H PRN Aletha Halim, MD   10 mg at 06/07/19 0539  . pantoprazole (PROTONIX) EC tablet 40 mg  40 mg Oral QHS Pierce, Dwayne A, RPH   40 mg at 06/07/19 2244  . polyethylene glycol (MIRALAX / GLYCOLAX) packet 17 g  17 g Oral Daily Aletha Halim, MD   17 g at 06/07/19 0958      Labs  Mg: 2.1 CBC Latest Ref Rng & Units 06/08/2019 06/06/2019 06/05/2019  WBC 4.0 - 10.5 K/uL 5.4 11.3(H) 4.7  Hemoglobin 12.0 - 15.0 g/dL 9.8(L) 9.1(L) 11.0(L)  Hematocrit 36.0 - 46.0 % 32.7(L) 30.4(L) 36.2  Platelets 150 - 400 K/uL 261 236 214   BMP Latest Ref Rng & Units 06/08/2019 06/06/2019 05/30/2019  Glucose 70 - 99 mg/dL 92 167(H) 110(H)  BUN 6 - 20 mg/dL 6 9 8   Creatinine 0.44 - 1.00 mg/dL 0.69 0.78 0.65  BUN/Creat Ratio 9 - 23 - - -  Sodium  135 - 145 mmol/L 137 134(L) 137  Potassium 3.5 - 5.1 mmol/L 3.9 4.2 3.6  Chloride 98 - 111 mmol/L 102 104 107  CO2 22 - 32 mmol/L 24 20(L) 22  Calcium 8.9 - 10.3 mg/dL 9.0 9.0 8.9     Radiology none  Assessment & Plan:  Pt doing well *GYN: final path benign. Pt amenable to trying dulcolax supp *HTN: continue home norvasc *Pain:  PO PRNs *FEN/GI: soft diet, sliv. Labs look great.  *PPx: SCDs, OOB today *Dispo: hopefully later today if can pass gas.   Code Status: Full Code  Durene Romans MD Attending Center for [employer] (Faculty Practice) [phone]

## 2019-06-08 NOTE — Discharge Instructions (Signed)
° °  Instructions Following Major SurgeryThe following list should answer your most common questions.  Although we will discuss your surgery and post-operative instructions with you prior to your discharge, this list will serve as a reminder if you fail to recall the details of what we discussed.  We will discuss your surgery once again in detail at your post-op visit in two to four weeks. If you havent already done so, please call to make your appointment as soon as possible.  How you will feel: Although you have just undergone a major surgery, you should feel slightly better each day.  If you suddenly feel much worse than the prior day, please call the clinic.  Its important during the early part of your recovery that you maintain some activity.  Walking is encouraged.  You will quicken your recovery by continued activity.  Incision:  Your incisions will be closed with dissolvable stitches and surgical adhesive (glue).  There may be Band-aids and/or Steri-strips covering your incisions.  If there is no drainage from the incisions you may remove the Band-aids in one to two days.  You may notice some minor bruising at the incision sites.  This is common and will resolve within several days.  Please inform us if the redness at the edges of your incision appears to be spreading.  If the skin around your incision becomes warm to the touch, or if you notice a pus-like drainage, please call the office.  Vaginal Discharge Following a Hysterectomy: Minor vaginal bleeding or spotting is normal following a hysterectomy.  Bleeding similar to the amount of your period is excessive, and you should inform us of this immediately.  Vaginal spotting may continue for several weeks following your surgery.  You may notice a yellowish discharge which occasionally occurs as the vaginal stitches dissolve, and may last for several weeks.  Sexual Activity Following a Hysterectomy: Do not have sexual intercourse or place tampons  or douches in the vagina prior to your first office visit.  We will discuss when you may resume these activities at that visit.    Stairs/Driving/Activities: You may climb stairs if necessary.  If youve had general anesthesia, do not drive a car the rest of the day today.  You may begin light housework when you feel up to it, but avoid heavy lifting (more than 15-20lbs) or pushing until cleared for these activities by your physician.  Hygiene:  Do not soak your incisions.  Showers are acceptable but you may not take a bath or swim in a pool.  Cleanse your incisions daily with soap and water.  Medications:  Please resume taking any medications that you were taking prior to the surgery.  If we have prescribed any new medications for you, please take them as directed.  Constipation:  It is fairly common to experience some difficulty in moving your bowels following major surgery.  Being active will help to reduce this likelihood. A diet rich in fiber and plenty of liquids is desirable.  If you do become constipated, a mild laxative such as Miralax, Milk of Magnesia, or Metamucil, or a stool softener such as Colace, is recommended.  General Instructions: If you develop a fever of 100.5 degrees or higher, please call the office number(s) below for physician on call.

## 2019-06-08 NOTE — Discharge Summary (Signed)
Discharge Summary   Admit Date: 06/05/2019 Discharge Date: 06/08/2019 Discharging Service: Gynecology  Primary OBGYN: [facility] Admitting Physician: Aletha Halim, MD  Discharge Physician: Ilda Basset  Primary Care Provider: [facility]  Admission Diagnoses: Scheduled hysterectomy for anemia, fibroids  Discharge Diagnoses: Status post hysterectomy. Endometriosis   Consult Orders: MEDS TO BEDS PHARMACY CONSULT ([facility] ONLY)   Surgeries/Procedures Performed: 10/6 Diagnostic laparoscopy, lysis of adhesions <30 minutes, total abdominal hysterectomy, left salpingectomy, cystoscopy.  History and Physical: Obstetrics & Gynecology Surgical H&P   Date of Surgery: 06/05/2019   Primary OBGYN: [facility] Primary Care Provider: Primary Care at [facility], [place])  Reason for Admission: scheduled hysterectomy  History of Present Illness: Ms. Dudzinski is a 43 y.o. G1P0010 (No LMP recorded. (Menstrual status: Irregular Periods).), with the above CC. PMHx is significant for fibroids, anemia, HTN.    Patient most recently admitted last week for pre-op labs showing anemia. She was transfused 3U and started on Lysteda. She currently has no bleeding or pain.   Prior to this, we had tried hysteroscopy, myomectomy with some relief but AUB, anemia recurred.  ROS: A 12-point review of systems was performed and negative, except as stated in the above HPI.  OBGYN History: As per HPI.                 OB History  Gravida Para Term Preterm AB Living  1 0     1    SAB TAB Ectopic Multiple Live Births         1           # Outcome Date GA Lbr Len/2nd Weight Sex Delivery Anes PTL Lv  1 Ectopic               Past Medical History:     Past Medical History:  Diagnosis Date  . Anemia   . Hypertension 11/06/2018    Past Surgical History:      Past Surgical History:  Procedure Laterality Date  .  DILATATION & CURETTAGE/HYSTEROSCOPY WITH MYOSURE N/A 01/31/2019   Procedure: DILATATION & CURETTAGE/HYSTEROSCOPY WITH MYOSURE AND MYOMECTOMY;  Surgeon: Aletha Halim, MD;  Location: [place];  Service: Gynecology;  Laterality: N/A;  . UNILATERAL SALPINGECTOMY Left 2007   Ectopic pregnancy. suprapubic mini-lap    Family History:       Family History  Problem Relation Age of Onset  . Diabetes Mother   . Hypertension Mother     Social History:  Social History        Socioeconomic History  . Marital status: Divorced    Spouse name: Not on file  . Number of children: 0  . Years of education: Not on file  . Highest education level: Some college, no degree  Occupational History  . Not on file  Social Needs  . Financial resource strain: Not on file  . Food insecurity    Worry: Not on file    Inability: Not on file  . Transportation needs    Medical: Yes    Non-medical: Yes  Tobacco Use  . Smoking status: Never Smoker  . Smokeless tobacco: Never Used  Substance and Sexual Activity  . Alcohol use: Yes    Comment: social  . Drug use: No    Comment: "edible a time or two, nothing current"  . Sexual activity: Not Currently    Comment: not within the last couple of months/sexual active with women  Lifestyle  .  Physical activity    Days per week: Not on file    Minutes per session: Not on file  . Stress: Not on file  Relationships  . Social Herbalist on phone: Not on file    Gets together: Not on file    Attends religious service: Not on file    Active member of club or organization: Not on file    Attends meetings of clubs or organizations: Not on file    Relationship status: Not on file  . Intimate partner violence    Fear of current or ex partner: Not on file    Emotionally abused: Not on file    Physically abused: Not on file    Forced sexual activity: Not on file  Other Topics Concern  .  Not on file  Social History Narrative  . Not on file     Allergy:     Allergies  Allergen Reactions  . Sulfa Antibiotics Hives and Itching    Current Outpatient Medications:        Medications Prior to Admission  Medication Sig Dispense Refill Last Dose  . amLODipine (NORVASC) 5 MG tablet Take 2 tablets (10 mg total) by mouth daily. 60 tablet 2   . ferrous gluconate (FERGON) 324 MG tablet Take 1 tablet (324 mg total) by mouth 2 (two) times daily with a meal. (Patient taking differently: Take 324 mg by mouth See admin instructions. Twice daily during heavy bleeding) 60 tablet 1   . senna (SENNA-LAX) 8.6 MG tablet Take 1 tablet by mouth daily as needed for constipation.         Hospital Medications:          Current Facility-Administered Medications  Medication Dose Route Frequency Provider Last Rate Last Dose  . ceFAZolin (ANCEF) IVPB 2g/100 mL premix  2 g Intravenous On Call to OR Aletha Halim, MD      . lactated ringers infusion   Intravenous Continuous Aletha Halim, MD         Physical Exam:   Current Vital Signs 24h Vital Sign Ranges  T 98.8 F (37.1 C) Temp  Avg: 98.8 F (37.1 C)  Min: 98.8 F (37.1 C)  Max: 98.8 F (37.1 C)  BP (!) 166/91 BP  Min: 166/91  Max: 166/91  HR (!) 59 Pulse  Avg: 59  Min: 59  Max: 59  RR 18 Resp  Avg: 18  Min: 18  Max: 18  SaO2 100 % Room Air SpO2  Avg: 100 %  Min: 100 %  Max: 100 %       24 Hour I/O Current Shift I/O  Time Ins Outs No intake/output data recorded. No intake/output data recorded.    There is no height or weight on file to calculate BMI. General appearance: Well nourished, well developed female in no acute distress.  Cardiovascular: S1, S2 normal, no murmur, rub or gallop, regular rate and rhythm Respiratory:  Clear to auscultation bilateral. Normal respiratory effort Abdomen: positive bowel sounds and no masses, hernias; diffusely non tender to palpation, non distended Neuro/Psych:   Normal mood and affect.  Skin:  Warm and dry.  Extremities: no clubbing, cyanosis, or edema.    Laboratory: Last Labs        Recent Labs  Lab 05/30/19 0932 05/30/19 1747 05/31/19 0840  WBC 3.7* 3.8*  --   HGB 5.5* 5.9* 10.0*  HCT 21.1* 23.1* 32.6*  PLT 383 362  --  Last Labs      Recent Labs  Lab 05/30/19 0932  NA 137  K 3.6  CL 107  CO2 22  BUN 8  CREATININE 0.65  CALCIUM 8.9  PROT 8.1  BILITOT 0.6  ALKPHOS 79  ALT 16  AST 20  GLUCOSE 110*      Last Labs      Recent Labs  Lab 05/30/19 1745  ABORH O POS      Imaging:  CLINICAL DATA: Follow-up complex LEFT ovarian cyst  EXAM: ULTRASOUND PELVIS TRANSVAGINAL  TECHNIQUE: Transvaginal ultrasound examination of the pelvis was performed including evaluation of the uterus, ovaries, adnexal regions, and pelvic cul-de-sac.  COMPARISON: 10/05/2018  FINDINGS: Uterus  Measurements: 13.5 x 8.1 x 7.6 cm = volume: 434 mL. Anteverted. Heterogeneous echogenicity. Large central mass within the uterus 4.5 x 4.0 x 4.1 cm compatible with leiomyoma. This extend submucosal and distorts the endometrial complex. Probable additional small uterine leiomyomata.  Endometrium  Thickness: 13 mm. Poorly visualized due to distortion by large central uterine mass period  Right ovary  Measurements: 3.2 x 1.5 x 1.5 cm = volume: 3.7 mL. Normal morphology without mass  Left ovary  Measurements: 3.2 x 2.1 x 2.2 cm = volume: 7.7 mL. Hypoechoic nodule 1.9 x 1.2 x 1.2 cm containing scattered internal echogenicity question small hemorrhagic cyst; this has decreased in size since the prior exam when it measured 3.6 x 3.1 x 2.2 cm. No additional masses.  Other findings: Trace free pelvic fluid.  IMPRESSION: Enlarged uterus containing leiomyomata including a 4.5 cm diameter central submucosal leiomyoma.  Interval decrease in size of hypoechoic complicated cyst within LEFT ovary likely  representing an evolving hemorrhagic cyst.  No new intrapelvic abnormalities.   Electronically Signed By: Lavonia Dana M.D. On: 10/19/2018 13:16  Assessment: pt doing well  Plan: D/w pt and amenable with proceeding with TLH/bilateral salpingectomy/cysto  Durene Romans MD Attending [facility] [phone]        Electronically signed by Aletha Halim, MD at 06/05/2019 9:02 AM   Hospital Course: *GYN: final path benign. On the day of discharge, she was meeting all post op goals including having had a BM *HTN: continue home norvasc *Pain:  PO PRNs *FEN/GI: soft diet *PPx: SCDs, OOB   Discharge Exam:  Patient Vitals for the past 24 hrs:  BP Temp Temp src Pulse Resp SpO2  06/08/19 0539 (!) 126/93 98.9 F (37.2 C) Oral 82 17 100 %  06/07/19 2155 134/84 98.5 F (36.9 C) Oral 91 16 100 %    Physical exam: General appearance: alert, cooperative and appears stated age Abdomen: c/d/i dressing x2 and l/s port site. Soft, nttp, mild to moderately distended, +BS Lungs: clear to auscultation bilaterally Heart: S1, S2 normal, no murmur, rub or gallop, regular rate and rhythm Skin: warm and dry Psych: appropriate Neurologic: Grossly normal  Discharge Disposition:  Home  Patient Instructions:  Standard   Results Pending at Discharge:  none  Discharge Medications: Allergies as of 06/08/2019      Reactions   Sulfa Antibiotics Hives, Itching      Medication List    STOP taking these medications   Senna-Lax 8.6 MG tablet Generic drug: senna     TAKE these medications   amLODipine 5 MG tablet Commonly known as: NORVASC Take 2 tablets (10 mg total) by mouth daily.   ferrous gluconate 324 MG tablet Commonly known as: FERGON Take 1 tablet (324 mg total) by mouth daily  with breakfast. What changed: when to take this   oxyCODONE-acetaminophen 5-325 MG tablet Commonly known as: Percocet Take 1-2 tablets by  mouth every 4 (four) hours as needed for severe pain.   polyethylene glycol 17 g packet Commonly known as: MIRALAX / GLYCOLAX Take 17 g by mouth daily.   simethicone 80 MG chewable tablet Commonly known as: Gas-X Chew 1 tablet (80 mg total) by mouth every 6 (six) hours as needed for flatulence.        No future appointments.  Request sent for 1wk incision check and 4-6wk post op visit  Durene Romans. MD Attending Center for [facility])

## 2019-06-08 NOTE — Plan of Care (Signed)
  Problem: Clinical Measurements: Goal: Will remain free from infection Outcome: Progressing   Problem: Activity: Goal: Risk for activity intolerance will decrease Outcome: Progressing   Problem: Nutrition: Goal: Adequate nutrition will be maintained Outcome: Progressing   Problem: Coping: Goal: Level of anxiety will decrease Outcome: Progressing   

## 2019-06-13 ENCOUNTER — Ambulatory Visit (INDEPENDENT_AMBULATORY_CARE_PROVIDER_SITE_OTHER): Payer: Self-pay | Admitting: Emergency Medicine

## 2019-06-13 ENCOUNTER — Other Ambulatory Visit: Payer: Self-pay

## 2019-06-13 VITALS — BP 125/82 | HR 79 | Wt 184.4 lb

## 2019-06-13 DIAGNOSIS — Z4889 Encounter for other specified surgical aftercare: Secondary | ICD-10-CM

## 2019-06-13 NOTE — Progress Notes (Signed)
Pt here today for incision check s/p hysterectomy on 10/6. Incision site has no redness, drainage, or odor. Incision edges well approximated. Pt reports having gas pain with minimal relief from simethicone and diarrhea with miralax. Per Dr. Elly Modena, pt instructed to increase physical activity to 15 minutes an hour. Pt also instructed to continue the simethicone and try warm water or warm apple juice for gas. Pt to stop miralax due to having diarrhea. Pt verbalized understanding and had no further questions.

## 2019-06-13 NOTE — Progress Notes (Signed)
Patient seen and assessed by nursing staff during this encounter. I have reviewed the chart and agree with the documentation and plan.  Mora Bellman, MD 06/13/2019 3:34 PM

## 2019-07-17 ENCOUNTER — Telehealth: Payer: Self-pay | Admitting: Obstetrics and Gynecology

## 2019-07-17 NOTE — Telephone Encounter (Signed)
Spoke to patient about her appointment on 11/18 @ 9:55. Patient instructed to wear a face mask for the entire appointment and no visitors are allowed with her during the visit. Patient screened for covid symptoms and denied having any

## 2019-07-18 ENCOUNTER — Other Ambulatory Visit: Payer: Self-pay

## 2019-07-18 ENCOUNTER — Encounter: Payer: Self-pay | Admitting: Obstetrics and Gynecology

## 2019-07-18 ENCOUNTER — Ambulatory Visit (INDEPENDENT_AMBULATORY_CARE_PROVIDER_SITE_OTHER): Payer: Self-pay | Admitting: Obstetrics and Gynecology

## 2019-07-18 VITALS — BP 141/87 | HR 73 | Wt 187.3 lb

## 2019-07-18 DIAGNOSIS — N809 Endometriosis, unspecified: Secondary | ICD-10-CM

## 2019-07-18 DIAGNOSIS — Z09 Encounter for follow-up examination after completed treatment for conditions other than malignant neoplasm: Secondary | ICD-10-CM

## 2019-07-18 DIAGNOSIS — Z9889 Other specified postprocedural states: Secondary | ICD-10-CM

## 2019-07-18 DIAGNOSIS — D649 Anemia, unspecified: Secondary | ICD-10-CM

## 2019-07-18 LAB — HEMOGLOBIN AND HEMATOCRIT, BLOOD
Hematocrit: 34.8 % (ref 34.0–46.6)
Hemoglobin: 11.2 g/dL (ref 11.1–15.9)

## 2019-07-18 NOTE — Progress Notes (Signed)
[facility] 07/18/2019  CC: regular post op check  Subjective:     Jodi Mendez is a 43 y.o. s/p 10/6 dx l/s, LOA converted to TAH/LS/cysto for large fibroid uterus. Pt discharged to home on pod#3  Doing well and having no issues and meeting all post op goals  Review of Systems Pertinent items are noted in HPI.    Objective:    BP (!) 141/87   Pulse 73   Wt 187 lb 4.8 oz (85 kg)   LMP 09/30/2018 (Exact Date)   BMI 31.65 kg/m  General:  alert  Abdomen: soft, bowel sounds active, non-tender  Incision:   healing well, no drainage, no erythema, no hernia, no seroma, no swelling, no dehiscence, incision well approximated    Pelvic: EGBUS normal; vaginal vault normal; cuff well healed, nttp Assessment:    Doing well postoperatively. Operative findings again reviewed. Pathology report discussed.    Plan:   Doing well. ?endo. F/u s/s at annual and if having pelvic pain can do u/s to assess ovaries but d/w her that may recommend po progestin suppression given ?endo on final pathology. Pt amenable to plan  RTC 6 months  Durene Romans MD Attending Center for [facility])

## 2020-05-18 ENCOUNTER — Other Ambulatory Visit: Payer: Self-pay

## 2020-05-18 ENCOUNTER — Emergency Department (HOSPITAL_COMMUNITY)
Admission: EM | Admit: 2020-05-18 | Discharge: 2020-05-18 | Disposition: A | Discharge status: Home / Self Care | Payer: Self-pay | Attending: Emergency Medicine | Admitting: Emergency Medicine

## 2020-05-18 ENCOUNTER — Encounter (HOSPITAL_COMMUNITY): Payer: Self-pay | Admitting: Emergency Medicine

## 2020-05-18 ENCOUNTER — Emergency Department (HOSPITAL_COMMUNITY): Payer: Self-pay

## 2020-05-18 DIAGNOSIS — M546 Pain in thoracic spine: Secondary | ICD-10-CM | POA: Insufficient documentation

## 2020-05-18 DIAGNOSIS — I10 Essential (primary) hypertension: Secondary | ICD-10-CM | POA: Insufficient documentation

## 2020-05-18 DIAGNOSIS — Z79899 Other long term (current) drug therapy: Secondary | ICD-10-CM | POA: Insufficient documentation

## 2020-05-18 DIAGNOSIS — Z8616 Personal history of COVID-19: Secondary | ICD-10-CM | POA: Insufficient documentation

## 2020-05-18 DIAGNOSIS — U071 COVID-19: Secondary | ICD-10-CM

## 2020-05-18 HISTORY — DX: COVID-19: U07.1

## 2020-05-18 LAB — CBC
HCT: 42.4 % (ref 36.0–46.0)
Hemoglobin: 13.4 g/dL (ref 12.0–15.0)
MCH: 28.3 pg (ref 26.0–34.0)
MCHC: 31.6 g/dL (ref 30.0–36.0)
MCV: 89.5 fL (ref 80.0–100.0)
Platelets: 273 10*3/uL (ref 150–400)
RBC: 4.74 MIL/uL (ref 3.87–5.11)
RDW: 13.3 % (ref 11.5–15.5)
WBC: 4.5 10*3/uL (ref 4.0–10.5)
nRBC: 0 % (ref 0.0–0.2)

## 2020-05-18 LAB — BASIC METABOLIC PANEL
Anion gap: 9 (ref 5–15)
BUN: 7 mg/dL (ref 6–20)
CO2: 25 mmol/L (ref 22–32)
Calcium: 9 mg/dL (ref 8.9–10.3)
Chloride: 104 mmol/L (ref 98–111)
Creatinine, Ser: 0.61 mg/dL (ref 0.44–1.00)
GFR calc Af Amer: >60 mL/min (ref >60)
GFR calc non Af Amer: >60 mL/min (ref >60)
Glucose, Bld: 104 mg/dL — ABNORMAL HIGH (ref 70–99)
Potassium: 3.6 mmol/L (ref 3.5–5.1)
Sodium: 138 mmol/L (ref 135–145)

## 2020-05-18 LAB — TROPONIN I (HIGH SENSITIVITY): Troponin I (High Sensitivity): 5 ng/L (ref <18)

## 2020-05-18 MED ORDER — CYCLOBENZAPRINE HCL 10 MG PO TABS: 10.0000 mg | ORAL_TABLET | Freq: Two times a day (BID) | ORAL | 0 refills | Status: AC | PRN

## 2020-05-18 NOTE — Discharge Instructions (Signed)
Please follow with your primary care provider at your visit today. You can take ibuprofen or Tylenol as needed for pain. You can take flexeril every 12 hours as needed for muscle spasm. Apply heat to symptom relief. Return to the ED if you experience shortness of breath, pain with breathing, or new or concerning symptoms.

## 2020-05-18 NOTE — ED Triage Notes (Signed)
COVID + 9/14.  Reports R sided thoracic back pain x 6 days.  Pt sitting in chair with water bottle against back and states that pain is improved with the pressure.  Denies SOB.  States she feels achy.

## 2020-05-18 NOTE — ED Provider Notes (Addendum)
[place] EMERGENCY DEPARTMENT Provider Note   CSN: [id number] Arrival date & time: 05/18/20  1634     History Chief Complaint  Patient presents with  . Covid Positive  . Back Pain    LAYSA Mendez is a 44 y.o. female w PMHx HTN, presenting to the emergency department for evaluation of right-sided thoracic back pain that started around September 10.  She states she began having cold-like symptoms on September 10 as well as right-sided achy back pain.  She states she thought she had a common cold, however was tested positive for Covid on 05/13/2020.  Her symptoms lasted a few days and have resolved.  She has some mild lingering cough though feels much better.  She is not having fevers, denies shortness of breath or remaining URI symptoms.  She states around the same time her cold symptoms started she began having right-sided thoracic back pain that feels like a "pulled muscle."  She states her pain is worse if she sits for prolonged periods of time or stands for prolonged periods of time.  She feels better when she puts a water bottle behind her back and leans against it -pressure to her back improves pain.  Her pain is not worse with breathing.  She does not have associated right upper quadrant abdominal pain, meals do not worsen her symptoms.  She denies shortness of breath or chest pain.  No hemoptysis.  No unilateral leg pain or swelling.   The history is provided by the patient.       Past Medical History:  Diagnosis Date  . Anemia   . COVID-19   . Hypertension 11/06/2018    Patient Active Problem List   Diagnosis Date Noted  . Endometriosis 06/06/2019  . Abnormal uterine bleeding (AUB) 06/05/2019  . Status post hysterectomy 06/05/2019  . Anemia 05/30/2019  . Hypertension 11/06/2018  . Chronic anemia 10/05/2018  . Menorrhagia 02/09/2013  . Leiomyoma of uterus 02/09/2013    Past Surgical History:  Procedure Laterality Date  . ABDOMINAL HYSTERECTOMY  Left 06/05/2019   Procedure: HYSTERECTOMY ABDOMINAL WITH LEFT SALPINGECTOMY;  Surgeon: Aletha Halim, MD;  Location: [place];  Service: Gynecology;  Laterality: Left;  . CYSTOSCOPY N/A 06/05/2019   Procedure: Cystoscopy;  Surgeon: Aletha Halim, MD;  Location: [place];  Service: Gynecology;  Laterality: N/A;  . DILATATION & CURETTAGE/HYSTEROSCOPY WITH MYOSURE N/A 01/31/2019   Procedure: [place] AND MYOMECTOMY;  Surgeon: Aletha Halim, MD;  Location: [place];  Service: Gynecology;  Laterality: N/A;  . LAPAROSCOPY N/A 06/05/2019   Procedure: LAPAROSCOPY DIAGNOSTIC;  Surgeon: Aletha Halim, MD;  Location: [place];  Service: Gynecology;  Laterality: N/A;  . UNILATERAL SALPINGECTOMY Left 2007   Ectopic pregnancy. suprapubic mini-lap     OB History    Gravida  1   Para  0   Term      Preterm      AB  1   Living        SAB      TAB      Ectopic  1   Multiple      Live Births              Family History  Problem Relation Age of Onset  . Diabetes Mother   . Hypertension Mother     Social History   Tobacco Use  . Smoking status: Never Smoker  . Smokeless tobacco: Never Used  Vaping Use  .  Vaping Use: Never used  Substance Use Topics  . Alcohol use: Yes    Comment: social  . Drug use: No    Comment: "edible a time or two, nothing current"    Home Medications Prior to Admission medications   Medication Sig Start Date End Date Taking? Authorizing Provider  amLODipine (NORVASC) 5 MG tablet Take 2 tablets (10 mg total) by mouth daily. 05/09/19   Charlott Rakes, MD  cyclobenzaprine (FLEXERIL) 10 MG tablet Take 1 tablet (10 mg total) by mouth 2 (two) times daily as needed for muscle spasms. 05/18/20   Aeisha Minarik, [place] N, PA-C    Allergies    Sulfa antibiotics  Review of Systems   Review of Systems  All other systems reviewed and are negative.   Physical Exam Updated Vital Signs BP (!) 161/104 (BP  Location: Right Arm)   Pulse 60   Temp 98.4 F (36.9 C) (Oral)   Resp 15   Ht 5' 3.5" (1.613 m)   Wt 86.2 kg   LMP 09/30/2018 (Exact Date)   SpO2 99%   BMI 33.13 kg/m   Physical Exam Vitals and nursing note reviewed.  Constitutional:      Appearance: She is well-developed.  HENT:     Head: Normocephalic and atraumatic.  Eyes:     Conjunctiva/sclera: Conjunctivae normal.  Cardiovascular:     Rate and Rhythm: Normal rate and regular rhythm.  Pulmonary:     Effort: Pulmonary effort is normal. No respiratory distress.     Breath sounds: Normal breath sounds.  Abdominal:     General: Bowel sounds are normal.     Palpations: Abdomen is soft.     Tenderness: There is no abdominal tenderness. There is no guarding or rebound. Negative signs include Murphy's sign.  Musculoskeletal:     Right lower leg: No edema.     Left lower leg: No edema.     Comments: TTP to right thoracic musculature below scapula and medial to inferior portion of scapula. No skin changes. No midline tenderness.   Skin:    General: Skin is warm.  Neurological:     Mental Status: She is alert.  Psychiatric:        Behavior: Behavior normal.     ED Results / Procedures / Treatments   Labs (all labs ordered are listed, but only abnormal results are displayed) Labs Reviewed  BASIC METABOLIC PANEL - Abnormal; Notable for the following components:      Result Value   Glucose, Bld 104 (*)    All other components within normal limits  CBC  TROPONIN I (HIGH SENSITIVITY)    EKG EKG Interpretation  Date/Time:  Sunday May 18 2020 17:19:23 EDT Ventricular Rate:  59 PR Interval:  110 QRS Duration: 72 QT Interval:  422 QTC Calculation: 417 R Axis:   22 Text Interpretation: Sinus bradycardia with short PR Otherwise normal ECG Since last tracing rate slower Otherwise no significant change Confirmed by Daleen Bo [phone]) on 05/18/2020 8:32:26 PM   Radiology DG Chest Portable 1 View  Result  Date: 05/18/2020 CLINICAL DATA:  Right-sided thoracic pain.  COVID-19 positive. EXAM: PORTABLE CHEST 1 VIEW COMPARISON:  December 12, 2006 FINDINGS: The heart size is borderline to mildly enlarged. The hila and mediastinum are normal. No pneumothorax. No nodules or masses. No definitive focal infiltrates identified. IMPRESSION: No definitive focal infiltrates or acute abnormalities are seen. Electronically Signed   By: Dorise Bullion III M.D   On: 05/18/2020 17:45  Procedures Procedures (including critical care time)  Medications Ordered in ED Medications - No data to display  ED Course  I have reviewed the triage vital signs and the nursing notes.  Pertinent labs & imaging results that were available during my care of the patient were reviewed by me and considered in my medical decision making (see chart for details).    MDM Rules/Calculators/A&P                          Patient with recent Covid illness presenting with persisting right sided thoracic back pain that feels like a pulled muscle.  Back pain started around the time her Covid symptoms started.  She states she felt like she had a mild cold began on the 10th.  Her symptoms lasted a few days and have been improving.  She has a lingering mild cough.  Pain is reported to feel like a "pulled muscle" that is worse with prolonged standing or sitting and improves with applying pressure to her back.  Pain is not pleuritic or postprandial, no associated abdominal pain.  She has no chest pain or shortness of breath.  On exam she is well-appearing and in no distress.  No tachycardia, tachypnea or hypoxia.  Pain is easily reproducible with palpation.  No skin changes.  Had shared decision making with patient.  Low suspicion for PE given timeline of symptom onset, lack of respiratory symptoms and reassuring examination.  Lab work obtained in triage is unremarkable with negative troponin, nonischemic EKG, unremarkable BMP and CBC.  Chest x-ray is  clear.  Patient is agreeable with plan for discharge for symptomatic management for likely musculoskeletal pain.  She is prescribed Flexeril and instructed of strict return precautions.  Verbalized understanding agrees with care plan.  Patient discussed with Dr. Eulis Foster, who agrees with workup and care plan.  Discussed results, findings, treatment and follow up. Patient advised of return precautions. Patient verbalized understanding and agreed with plan.  Final Clinical Impression(s) / ED Diagnoses Final diagnoses:  Acute right-sided thoracic back pain  COVID-19    Rx / DC Orders ED Discharge Orders         Ordered    cyclobenzaprine (FLEXERIL) 10 MG tablet  2 times daily PRN        05/18/20 1920           Elliotte Marsalis, [place] N, PA-C 05/18/20 1950    Daleen Bo, MD 05/18/20 2025    Renelda Kilian, [place] N, PA-C 05/18/20 2032    Daleen Bo, MD 05/18/20 2034

## 2021-01-10 IMAGING — MG DIGITAL SCREENING BILATERAL MAMMOGRAM WITH TOMO AND CAD
8 series · 8 of 24 positions shown · non-contrast
Comparison: None.

CLINICAL DATA: Screening.

EXAM:
DIGITAL SCREENING BILATERAL MAMMOGRAM WITH TOMO AND CAD

[L MLO synth-2D]
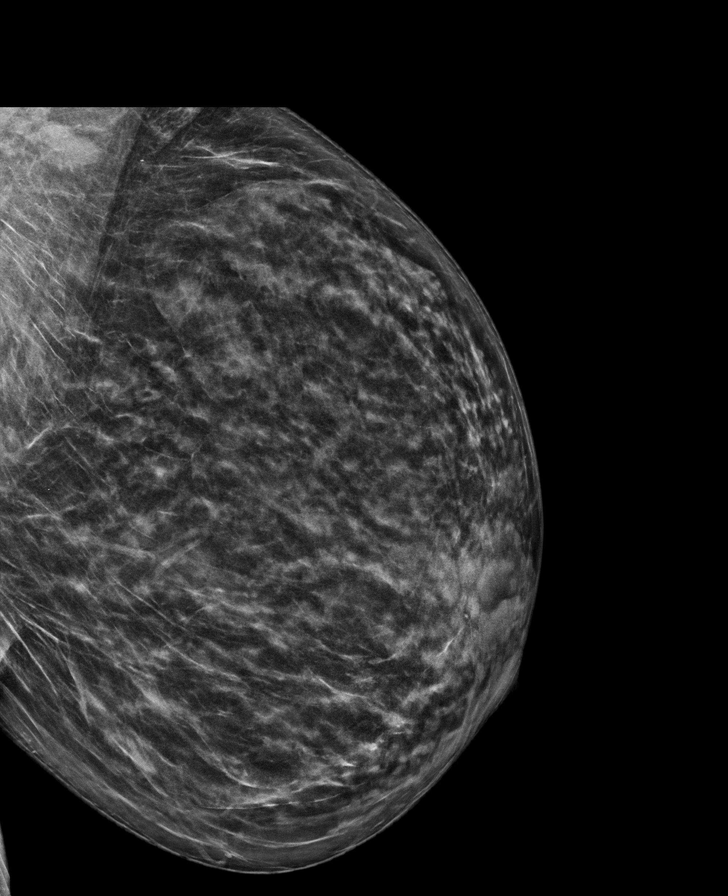

[R MLO synth-2D]
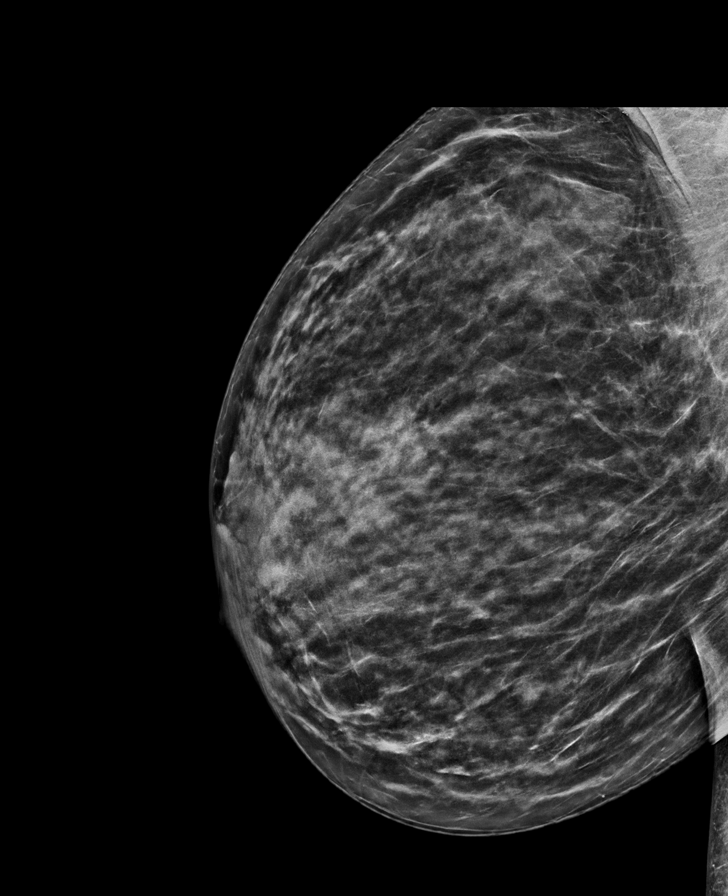

[L CC synth-2D]
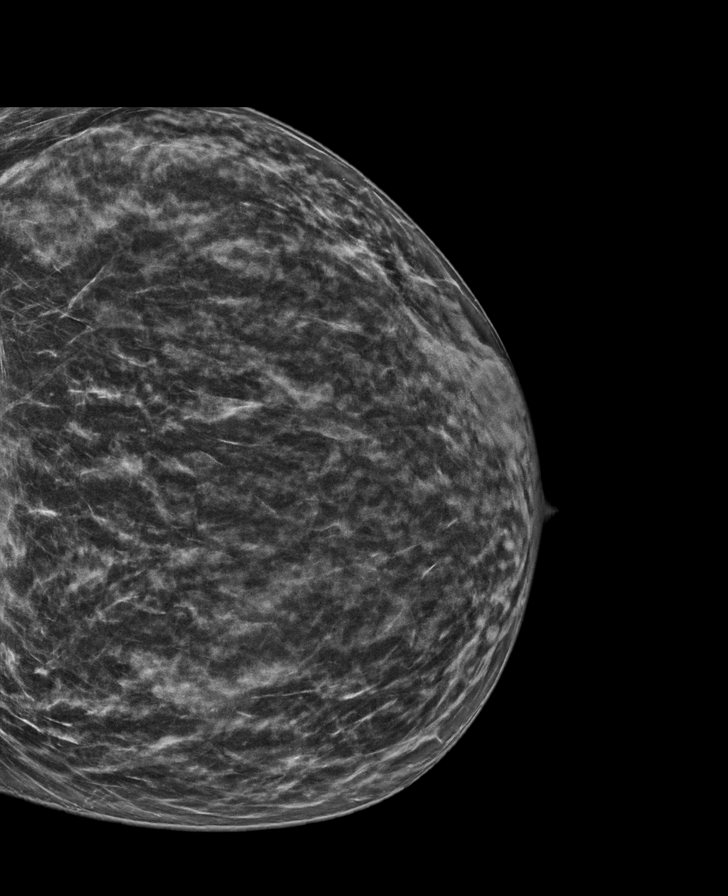

[R CC synth-2D]
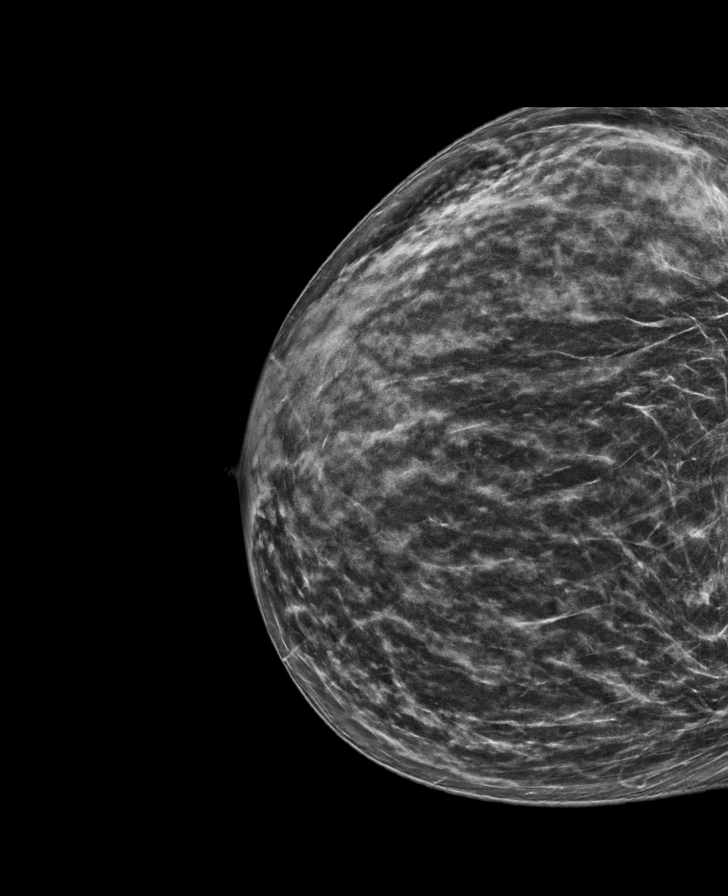

[R CC tomo · tomo slice 33/66.0]
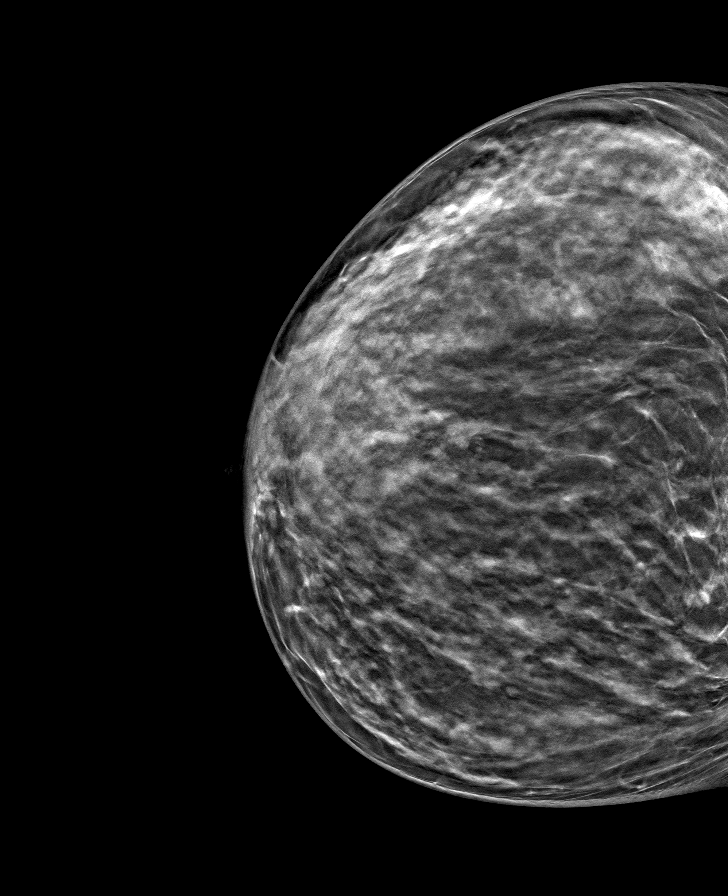

[R MLO tomo · tomo slice 35/68.0]
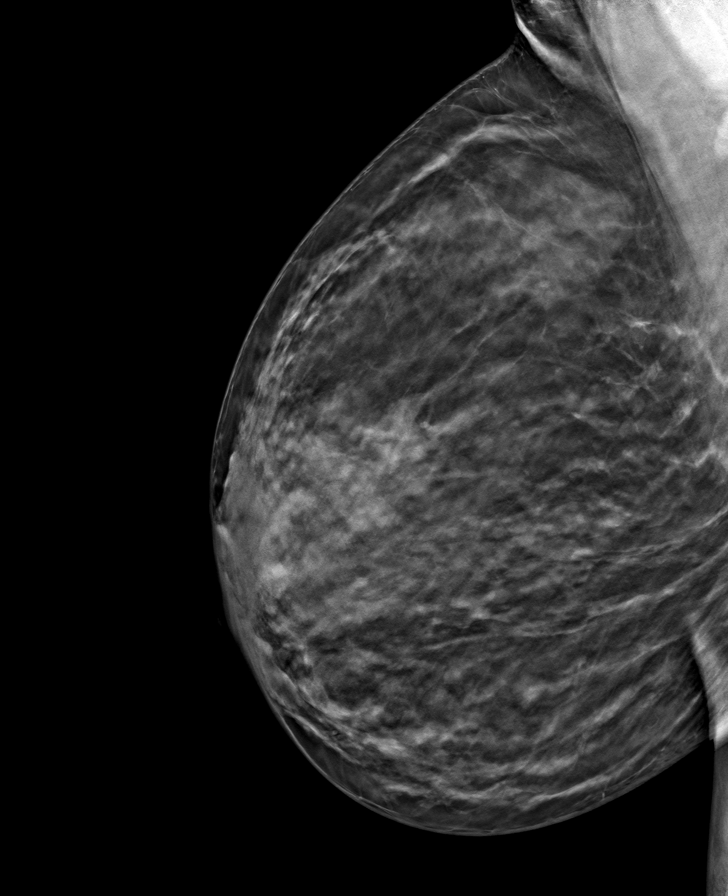

[L MLO tomo · tomo slice 36/71.0]
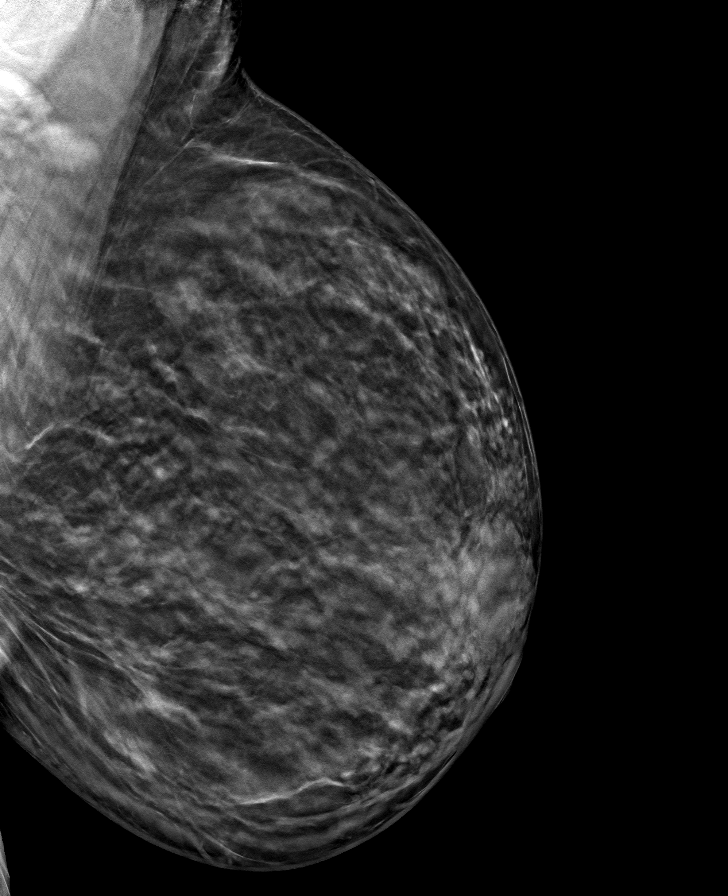

[L CC tomo · tomo slice 33/64.0]
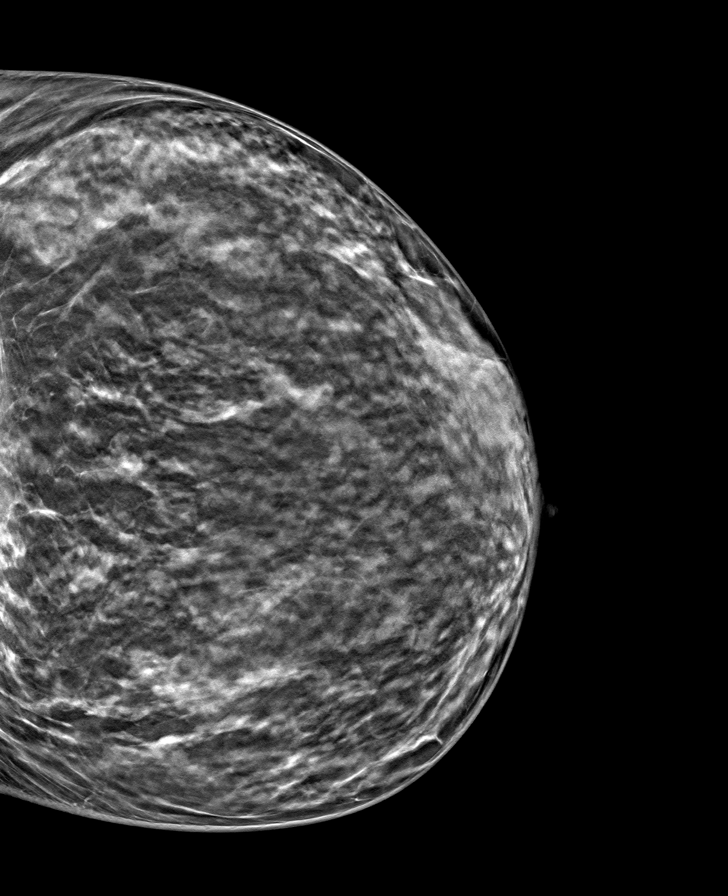

[8 of 24 positions shown; findings below may reference images not displayed]

ACR Breast Density Category c: The breast tissue is heterogeneously
dense, which may obscure small masses
FINDINGS: There are no findings suspicious for malignancy. Images were
processed with CAD.
IMPRESSION: No mammographic evidence of malignancy. A result letter of this
screening mammogram will be mailed directly to the patient.

RECOMMENDATION:
Screening mammogram in one year. (Code:EM-2-IHY)

BI-RADS CATEGORY  1: Negative.

## 2021-04-06 ENCOUNTER — Ambulatory Visit (INDEPENDENT_AMBULATORY_CARE_PROVIDER_SITE_OTHER): Payer: 59

## 2021-04-06 ENCOUNTER — Other Ambulatory Visit: Payer: Self-pay

## 2021-04-06 ENCOUNTER — Ambulatory Visit
Admission: EM | Admit: 2021-04-06 | Discharge: 2021-04-06 | Disposition: A | Discharge status: Home / Self Care | Payer: 59 | Attending: Internal Medicine | Admitting: Internal Medicine

## 2021-04-06 DIAGNOSIS — M25532 Pain in left wrist: Secondary | ICD-10-CM | POA: Diagnosis not present

## 2021-04-06 DIAGNOSIS — G5602 Carpal tunnel syndrome, left upper limb: Secondary | ICD-10-CM

## 2021-04-06 MED ORDER — NAPROXEN 500 MG PO TABS: 500.0000 mg | ORAL_TABLET | Freq: Two times a day (BID) | ORAL | 0 refills | Status: AC

## 2021-04-06 MED ORDER — NAPROXEN 500 MG PO TABS: 500.0000 mg | ORAL_TABLET | Freq: Two times a day (BID) | ORAL | 1 refills | Status: DC

## 2021-04-06 NOTE — ED Triage Notes (Signed)
5 mo h/o intermittent left wrist pain that has gradually worsened. Pt notes sxs have significantly increased in the last few weeks. 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 Pain occurs mostly at night when she gets off work. Denies numbness and tingling. No finger pain. No right arm sxs. No falls or injuries noted.

## 2021-04-06 NOTE — Discharge Instructions (Addendum)
Can use naproxen twice a day as need to help with pain and swelling   Can continue wear the brace for comfort  Can apply heat to area in 15 minutes intervals as needed for additional comfort   Follow up orthopedic evaluation if pain persist for evaluation

## 2021-04-07 NOTE — ED Provider Notes (Addendum)
[facility]    CSN: [id number] Arrival date & time: 04/06/21  1912      History   Chief Complaint Chief Complaint  Patient presents with   Wrist Pain    left    HPI Jodi Mendez is a 45 y.o. female.   Patient presents with intermittent left wrist pain for 5 months that has progressively worsened over the last few weeks. 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 ROM intact. Pain typically about 5/10. Denies numbness, tingling, prior injury, trauma. Using aleve with no relief.   Past Medical History:  Diagnosis Date   Anemia    COVID-19    Hypertension 11/06/2018    Patient Active Problem List   Diagnosis Date Noted   Endometriosis 06/06/2019   Abnormal uterine bleeding (AUB) 06/05/2019   Status post hysterectomy 06/05/2019   Anemia 05/30/2019   Hypertension 11/06/2018   Chronic anemia 10/05/2018   Menorrhagia 02/09/2013   Leiomyoma of uterus 02/09/2013    Past Surgical History:  Procedure Laterality Date   ABDOMINAL HYSTERECTOMY Left 06/05/2019   Procedure: HYSTERECTOMY ABDOMINAL WITH LEFT SALPINGECTOMY;  Surgeon: Aletha Halim, MD;  Location: [place];  Service: Gynecology;  Laterality: Left;   CYSTOSCOPY N/A 06/05/2019   Procedure: Cystoscopy;  Surgeon: Aletha Halim, MD;  Location: [place];  Service: Gynecology;  Laterality: N/A;   DILATATION & CURETTAGE/HYSTEROSCOPY WITH MYOSURE N/A 01/31/2019   Procedure: DILATATION & CURETTAGE/HYSTEROSCOPY WITH MYOSURE AND MYOMECTOMY;  Surgeon: Aletha Halim, MD;  Location: [place];  Service: Gynecology;  Laterality: N/A;   LAPAROSCOPY N/A 06/05/2019   Procedure: LAPAROSCOPY DIAGNOSTIC;  Surgeon: Aletha Halim, MD;  Location: [place];  Service: Gynecology;  Laterality: N/A;   UNILATERAL SALPINGECTOMY Left 2007   Ectopic pregnancy. suprapubic mini-lap    OB History     Gravida  1   Para  0   Term      Preterm      AB  1   Living          SAB      IAB      Ectopic  1   Multiple      Live Births               Home Medications    Prior to Admission medications   Medication Sig Start Date End Date Taking? Authorizing Provider  amLODipine (NORVASC) 5 MG tablet Take 2 tablets (10 mg total) by mouth daily. 05/09/19   Charlott Rakes, MD  cyclobenzaprine (FLEXERIL) 10 MG tablet Take 1 tablet (10 mg total) by mouth 2 (two) times daily as needed for muscle spasms. 05/18/20   Robinson, [place] N, PA-C  naproxen (NAPROSYN) 500 MG tablet Take 1 tablet (500 mg total) by mouth 2 (two) times daily. 04/06/21   Hans Eden, NP    Family History Family History  Problem Relation Age of Onset   Diabetes Mother    Hypertension Mother     Social History Social History   Tobacco Use   Smoking status: Never   Smokeless tobacco: Never  Vaping Use   Vaping Use: Never used  Substance Use Topics   Alcohol use: Yes    Comment: social   Drug use: No    Comment: "edible a time or two, nothing current"     Allergies   Sulfa antibiotics   Review of Systems Review of Systems  Constitutional: Negative.   Respiratory: Negative.  Cardiovascular: Negative.   Musculoskeletal:  Positive for arthralgias. Negative for back pain, gait problem, joint swelling, myalgias, neck pain and neck stiffness.  Skin: Negative.   Neurological: Negative.     Physical Exam Triage Vital Signs ED Triage Vitals  Enc Vitals Group     BP 04/06/21 1956 (!) 169/102     Pulse Rate 04/06/21 1956 60     Resp 04/06/21 1956 18     Temp 04/06/21 1956 98.2 F (36.8 C)     Temp Source 04/06/21 1956 Oral     SpO2 04/06/21 1956 98 %     Weight --      Height --      Head Circumference --      Peak Flow --      Pain Score 04/06/21 1955 6     Pain Loc --      Pain Edu? --      Excl. in [place]? --    No data found.  Updated Vital Signs BP (!) 169/102 (BP Location: Left Arm)   Pulse 60   Temp 98.2 F (36.8 C) (Oral)   Resp 18   LMP  09/30/2018 (Exact Date)   SpO2 98%   Visual Acuity Right Eye Distance:   Left Eye Distance:   Bilateral Distance:    Right Eye Near:   Left Eye Near:    Bilateral Near:     Physical Exam Constitutional:      Appearance: Normal appearance. She is normal weight.  HENT:     Head: Normocephalic.  Eyes:     Extraocular Movements: Extraocular movements intact.  Pulmonary:     Effort: Pulmonary effort is normal.  Musculoskeletal:       Arms:     Comments: Left wrist and hand- Point tenderness and mild swelling at the base of left thumb, negative tinel sign, negative Finkelstein sign, ROM intact, 2 + radial pulse   Skin:    General: Skin is warm and dry.  Neurological:     Mental Status: She is alert and oriented to person, place, and time. Mental status is at baseline.  Psychiatric:        Mood and Affect: Mood normal.        Behavior: Behavior normal.     UC Treatments / Results  Labs (all labs ordered are listed, but only abnormal results are displayed) Labs Reviewed - No data to display  EKG   Radiology DG Wrist Complete Left  Result Date: 04/06/2021 CLINICAL DATA:  Left wrist pain for 1 month, no known injury, initial encounter EXAM: LEFT WRIST - COMPLETE 3+ VIEW COMPARISON:  None. FINDINGS: There is no evidence of fracture or dislocation. There is no evidence of arthropathy or other focal bone abnormality. Soft tissues are unremarkable. IMPRESSION: No acute abnormality noted. Electronically Signed   By: Inez Catalina M.D.   On: 04/06/2021 19:55    Procedures Procedures (including critical care time)  Medications Ordered in UC Medications - No data to display  Initial Impression / Assessment and Plan / UC Course  I have reviewed the triage vital signs and the nursing notes.  Pertinent labs & imaging results that were available during my care of the patient were reviewed by me and considered in my medical decision making (see chart for details).  Acute carpal  tunnel syndrome, left  X-ray wrist-negative   Naproxen 500 mg bid  Brace for comfort Heating pad 15 minutes intervals as needed Ortho follow up as  needed  Final Clinical Impressions(s) / UC Diagnoses   Final diagnoses:  Acute carpal tunnel syndrome, left     Discharge Instructions      Can use naproxen twice a day as need to help with pain and swelling   Can continue wear the brace for comfort  Can apply heat to area in 15 minutes intervals as needed for additional comfort   Follow up orthopedic evaluation if pain persist for evaluation    ED Prescriptions     Medication Sig Dispense Auth. Provider   naproxen (NAPROSYN) 500 MG tablet  (Status: Discontinued) Take 1 tablet (500 mg total) by mouth 2 (two) times daily. 30 tablet Kaina Orengo R, NP   naproxen (NAPROSYN) 500 MG tablet Take 1 tablet (500 mg total) by mouth 2 (two) times daily. 60 tablet Colisha Redler, Leitha Schuller, NP      PDMP not reviewed this encounter.   Hans Eden, NP 04/07/21 0815    Hans Eden, NP 04/07/21 [phone]

## 2021-04-24 ENCOUNTER — Other Ambulatory Visit: Payer: Self-pay

## 2021-04-24 ENCOUNTER — Ambulatory Visit
Admission: EM | Admit: 2021-04-24 | Discharge: 2021-04-24 | Disposition: A | Discharge status: Home / Self Care | Payer: 59 | Attending: Internal Medicine | Admitting: Internal Medicine

## 2021-04-24 DIAGNOSIS — W57XXXA Bitten or stung by nonvenomous insect and other nonvenomous arthropods, initial encounter: Secondary | ICD-10-CM

## 2021-04-24 DIAGNOSIS — S1086XA Insect bite of other specified part of neck, initial encounter: Secondary | ICD-10-CM

## 2021-04-24 DIAGNOSIS — G5602 Carpal tunnel syndrome, left upper limb: Secondary | ICD-10-CM | POA: Diagnosis not present

## 2021-04-24 MED ORDER — PREDNISONE 20 MG PO TABS: 40.0000 mg | ORAL_TABLET | Freq: Every day | ORAL | 0 refills | Status: AC

## 2021-04-24 MED ORDER — CETIRIZINE HCL 10 MG PO TABS: 10.0000 mg | ORAL_TABLET | Freq: Every day | ORAL | 0 refills | Status: AC

## 2021-04-24 NOTE — ED Provider Notes (Signed)
[facility]    CSN: [id number] Arrival date & time: 04/24/21  1714      History   Chief Complaint Chief Complaint  Patient presents with   left wrist pain    HPI Jodi Mendez is a 45 y.o. female.   Patient presents for follow-up of left wrist pain.  Patient was seen on 8/8 and was diagnosed with carpal tunnel syndrome.  Was prescribed naproxen and wrist brace but pain has not resolved.  Denies any numbness or tingling.  Also has a lesion on back of neck that patient thinks is a result of an insect bite. Has not yet taken any medication to help treat. Denies any drainage from lesion. Denies that lesion is painful but states that it is itchy.     Past Medical History:  Diagnosis Date   Anemia    COVID-19    Hypertension 11/06/2018    Patient Active Problem List   Diagnosis Date Noted   Endometriosis 06/06/2019   Abnormal uterine bleeding (AUB) 06/05/2019   Status post hysterectomy 06/05/2019   Anemia 05/30/2019   Hypertension 11/06/2018   Chronic anemia 10/05/2018   Menorrhagia 02/09/2013   Leiomyoma of uterus 02/09/2013    Past Surgical History:  Procedure Laterality Date   ABDOMINAL HYSTERECTOMY Left 06/05/2019   Procedure: HYSTERECTOMY ABDOMINAL WITH LEFT SALPINGECTOMY;  Surgeon: Aletha Halim, MD;  Location: [place];  Service: Gynecology;  Laterality: Left;   CYSTOSCOPY N/A 06/05/2019   Procedure: Cystoscopy;  Surgeon: Aletha Halim, MD;  Location: [place];  Service: Gynecology;  Laterality: N/A;   DILATATION & CURETTAGE/HYSTEROSCOPY WITH MYOSURE N/A 01/31/2019   Procedure: DILATATION & CURETTAGE/HYSTEROSCOPY WITH MYOSURE AND MYOMECTOMY;  Surgeon: Aletha Halim, MD;  Location: [place];  Service: Gynecology;  Laterality: N/A;   LAPAROSCOPY N/A 06/05/2019   Procedure: LAPAROSCOPY DIAGNOSTIC;  Surgeon: Aletha Halim, MD;  Location: [place];  Service: Gynecology;  Laterality: N/A;   UNILATERAL SALPINGECTOMY Left 2007   Ectopic  pregnancy. suprapubic mini-lap    OB History     Gravida  1   Para  0   Term      Preterm      AB  1   Living         SAB      IAB      Ectopic  1   Multiple      Live Births               Home Medications    Prior to Admission medications   Medication Sig Start Date End Date Taking? Authorizing Provider  cetirizine (ZYRTEC) 10 MG tablet Take 1 tablet (10 mg total) by mouth daily. 04/24/21  Yes Odis Luster, FNP  predniSONE (DELTASONE) 20 MG tablet Take 2 tablets (40 mg total) by mouth daily for 5 days. 04/24/21 04/29/21 Yes Odis Luster, FNP  amLODipine (NORVASC) 5 MG tablet Take 2 tablets (10 mg total) by mouth daily. 05/09/19   Charlott Rakes, MD  cyclobenzaprine (FLEXERIL) 10 MG tablet Take 1 tablet (10 mg total) by mouth 2 (two) times daily as needed for muscle spasms. 05/18/20   Robinson, [place] N, PA-C  naproxen (NAPROSYN) 500 MG tablet Take 1 tablet (500 mg total) by mouth 2 (two) times daily. 04/06/21   Hans Eden, NP    Family History Family History  Problem Relation Age of Onset   Diabetes Mother    Hypertension Mother     Social History Social  History   Tobacco Use   Smoking status: Never   Smokeless tobacco: Never  Vaping Use   Vaping Use: Never used  Substance Use Topics   Alcohol use: Yes    Comment: social   Drug use: No    Comment: "edible a time or two, nothing current"     Allergies   Sulfa antibiotics   Review of Systems Review of Systems Per HPI  Physical Exam Triage Vital Signs ED Triage Vitals  Enc Vitals Group     BP 04/24/21 1756 (!) 151/85     Pulse Rate 04/24/21 1756 68     Resp 04/24/21 1756 18     Temp 04/24/21 1756 97.9 F (36.6 C)     Temp Source 04/24/21 1756 Oral     SpO2 04/24/21 1756 96 %     Weight --      Height --      Head Circumference --      Peak Flow --      Pain Score 04/24/21 1757 0     Pain Loc --      Pain Edu? --      Excl. in [place]? --    No data found.  Updated Vital  Signs BP (!) 151/85 (BP Location: Left Arm)   Pulse 68   Temp 97.9 F (36.6 C) (Oral)   Resp 18   LMP 09/30/2018 (Exact Date)   SpO2 96%   Visual Acuity Right Eye Distance:   Left Eye Distance:   Bilateral Distance:    Right Eye Near:   Left Eye Near:    Bilateral Near:     Physical Exam Constitutional:      Appearance: Normal appearance.  HENT:     Head: Normocephalic and atraumatic.  Eyes:     Extraocular Movements: Extraocular movements intact.     Conjunctiva/sclera: Conjunctivae normal.  Pulmonary:     Effort: Pulmonary effort is normal.  Musculoskeletal:     Right wrist: Normal.     Left wrist: Normal. No tenderness or bony tenderness. Normal range of motion. Normal pulse.     Right hand: Normal.     Left hand: Normal.     Comments: No tenderness to palpation to left wrist. Negative Phalen's maneuver. Negative Tinel's sign. Neurovascular intact.  Skin:    Comments: Raised, erythematous, circular lesion that is approximately 1-2 cm in diameter present to back of neck   Neurological:     General: No focal deficit present.     Mental Status: She is alert and oriented to person, place, and time. Mental status is at baseline.  Psychiatric:        Mood and Affect: Mood normal.        Behavior: Behavior normal.        Thought Content: Thought content normal.        Judgment: Judgment normal.      UC Treatments / Results  Labs (all labs ordered are listed, but only abnormal results are displayed) Labs Reviewed - No data to display  EKG   Radiology No results found.  Procedures Procedures (including critical care time)  Medications Ordered in UC Medications - No data to display  Initial Impression / Assessment and Plan / UC Course  I have reviewed the triage vital signs and the nursing notes.  Pertinent labs & imaging results that were available during my care of the patient were reviewed by me and considered in my medical decision making (see  chart  for details).     Will treat with prednisone oral course due to pain being refractory to NSAIDs and wrist brace.  Patient to continue wrist brace.  Advised patient to follow-up with provided contact information for orthopedist for further evaluation and management.  Will treat possible insect bite to the back of neck with cetirizine antihistamine and cool compresses.  Advised patient to follow-up if lesion does not improve. Discussed strict return precautions. Patient verbalized understanding and is agreeable with plan.  Final Clinical Impressions(s) / UC Diagnoses   Final diagnoses:  Carpal tunnel syndrome of left wrist  Insect bite of other part of neck, initial encounter     Discharge Instructions      Follow-up with orthopedics provided contact information if pain persists.  Do not any ibuprofen, Advil, Aleve, naproxen while on steroid. Continue wrist brace.      ED Prescriptions     Medication Sig Dispense Auth. Provider   predniSONE (DELTASONE) 20 MG tablet Take 2 tablets (40 mg total) by mouth daily for 5 days. 10 tablet Odis Luster, FNP   cetirizine (ZYRTEC) 10 MG tablet Take 1 tablet (10 mg total) by mouth daily. 30 tablet Odis Luster, FNP      PDMP not reviewed this encounter.   Odis Luster, FNP 04/24/21 1902

## 2021-04-24 NOTE — ED Triage Notes (Signed)
Pt c/o 2 things:  1) lesion/abscess on back of neck onset last Wednesday. States it is itchy but denies pain at this time.   2) left wrist pain onset several weeks ago. Described as achy and numbness mostly in the morning. States was here several weeks ago and was dx with carpal tunnel and given naproxen without relief.

## 2021-04-24 NOTE — Discharge Instructions (Addendum)
Follow-up with orthopedics provided contact information if pain persists.  Do not any ibuprofen, Advil, Aleve, naproxen while on steroid. Continue wrist brace.

## 2022-05-14 ENCOUNTER — Other Ambulatory Visit: Payer: Self-pay | Admitting: Family Medicine

## 2022-05-14 DIAGNOSIS — Z1231 Encounter for screening mammogram for malignant neoplasm of breast: Secondary | ICD-10-CM

## 2022-06-02 LAB — COLOGUARD: COLOGUARD: NEGATIVE

## 2022-06-17 ENCOUNTER — Ambulatory Visit
Admission: RE | Admit: 2022-06-17 | Discharge: 2022-06-17 | Disposition: A | Discharge status: Home / Self Care | Payer: Commercial Managed Care - PPO | Source: Ambulatory Visit | Attending: Family Medicine | Admitting: Family Medicine

## 2022-06-17 DIAGNOSIS — Z1231 Encounter for screening mammogram for malignant neoplasm of breast: Secondary | ICD-10-CM

## 2023-04-13 ENCOUNTER — Other Ambulatory Visit: Payer: Self-pay | Admitting: Physician Assistant

## 2023-04-13 DIAGNOSIS — Z1231 Encounter for screening mammogram for malignant neoplasm of breast: Secondary | ICD-10-CM

## 2023-06-24 ENCOUNTER — Ambulatory Visit
Admission: RE | Admit: 2023-06-24 | Discharge: 2023-06-24 | Disposition: A | Discharge status: Home / Self Care | Payer: Commercial Managed Care - PPO | Source: Ambulatory Visit | Attending: Physician Assistant | Admitting: Physician Assistant

## 2023-06-24 DIAGNOSIS — Z1231 Encounter for screening mammogram for malignant neoplasm of breast: Secondary | ICD-10-CM
